# Patient Record
Sex: Female | Born: 1970
Health system: Southern US, Community
[De-identification: ages and names within clinical notes are randomized; demographics above are authoritative.]

## PROBLEM LIST (undated history)

## (undated) DIAGNOSIS — Z973 Presence of spectacles and contact lenses: Secondary | ICD-10-CM

## (undated) DIAGNOSIS — Z9884 Bariatric surgery status: Secondary | ICD-10-CM

## (undated) DIAGNOSIS — T4145XA Adverse effect of unspecified anesthetic, initial encounter: Secondary | ICD-10-CM

## (undated) DIAGNOSIS — D649 Anemia, unspecified: Secondary | ICD-10-CM

## (undated) DIAGNOSIS — R112 Nausea with vomiting, unspecified: Secondary | ICD-10-CM

## (undated) DIAGNOSIS — T8859XA Other complications of anesthesia, initial encounter: Secondary | ICD-10-CM

## (undated) DIAGNOSIS — K219 Gastro-esophageal reflux disease without esophagitis: Secondary | ICD-10-CM

## (undated) DIAGNOSIS — Z87898 Personal history of other specified conditions: Secondary | ICD-10-CM

## (undated) HISTORY — PX: OTHER SURGICAL HISTORY: SHX169

## (undated) HISTORY — PX: GASTRIC BYPASS: SHX52

## (undated) HISTORY — PX: FOOT SURGERY: SHX648

## (undated) HISTORY — PX: TONSILLECTOMY AND ADENOIDECTOMY: SUR1326

---
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
## 1997-08-01 ENCOUNTER — Other Ambulatory Visit: Admission: RE | Admit: 1997-08-01 | Discharge: 1997-08-01 | Payer: Self-pay

## 1998-07-26 ENCOUNTER — Other Ambulatory Visit: Admission: RE | Admit: 1998-07-26 | Discharge: 1998-07-26 | Payer: Self-pay | Admitting: Family Medicine

## 1998-09-04 ENCOUNTER — Ambulatory Visit (HOSPITAL_COMMUNITY): Admission: RE | Admit: 1998-09-04 | Discharge: 1998-09-04 | Payer: Self-pay | Admitting: Family Medicine

## 1998-09-04 ENCOUNTER — Encounter: Payer: Self-pay | Admitting: Family Medicine

## 1999-01-11 ENCOUNTER — Inpatient Hospital Stay (HOSPITAL_COMMUNITY): Admission: AD | Admit: 1999-01-11 | Discharge: 1999-01-14 | Payer: Self-pay | Admitting: Family Medicine

## 1999-02-18 ENCOUNTER — Encounter: Admission: RE | Admit: 1999-02-18 | Discharge: 1999-05-19 | Payer: Self-pay | Admitting: Family Medicine

## 1999-06-18 ENCOUNTER — Encounter: Admission: RE | Admit: 1999-06-18 | Discharge: 1999-09-16 | Payer: Self-pay | Admitting: Family Medicine

## 2000-02-11 ENCOUNTER — Other Ambulatory Visit: Admission: RE | Admit: 2000-02-11 | Discharge: 2000-02-11 | Payer: Self-pay | Admitting: Family Medicine

## 2001-03-16 ENCOUNTER — Other Ambulatory Visit: Admission: RE | Admit: 2001-03-16 | Discharge: 2001-03-16 | Payer: Self-pay | Admitting: Family Medicine

## 2001-03-16 ENCOUNTER — Other Ambulatory Visit: Admission: RE | Admit: 2001-03-16 | Discharge: 2001-03-16 | Payer: Self-pay | Admitting: Unknown Physician Specialty

## 2002-02-10 ENCOUNTER — Ambulatory Visit (HOSPITAL_COMMUNITY): Admission: RE | Admit: 2002-02-10 | Discharge: 2002-02-10 | Payer: Self-pay | Admitting: Family Medicine

## 2002-02-10 ENCOUNTER — Encounter: Payer: Self-pay | Admitting: Family Medicine

## 2002-02-27 ENCOUNTER — Ambulatory Visit (HOSPITAL_BASED_OUTPATIENT_CLINIC_OR_DEPARTMENT_OTHER): Admission: RE | Admit: 2002-02-27 | Discharge: 2002-02-27 | Payer: Self-pay | Admitting: Family Medicine

## 2005-01-08 ENCOUNTER — Ambulatory Visit (HOSPITAL_COMMUNITY): Admission: RE | Admit: 2005-01-08 | Discharge: 2005-01-08 | Payer: Self-pay | Admitting: Family Medicine

## 2005-01-20 ENCOUNTER — Other Ambulatory Visit: Admission: RE | Admit: 2005-01-20 | Discharge: 2005-01-20 | Payer: Self-pay | Admitting: Family Medicine

## 2007-03-23 ENCOUNTER — Ambulatory Visit (HOSPITAL_COMMUNITY): Admission: RE | Admit: 2007-03-23 | Discharge: 2007-03-23 | Payer: Self-pay | Admitting: Obstetrics and Gynecology

## 2007-03-27 ENCOUNTER — Inpatient Hospital Stay (HOSPITAL_COMMUNITY): Admission: AD | Admit: 2007-03-27 | Discharge: 2007-03-27 | Payer: Self-pay | Admitting: Obstetrics and Gynecology

## 2008-04-10 ENCOUNTER — Emergency Department (HOSPITAL_COMMUNITY): Admission: EM | Admit: 2008-04-10 | Discharge: 2008-04-10 | Payer: Self-pay | Admitting: Family Medicine

## 2009-07-16 ENCOUNTER — Inpatient Hospital Stay (HOSPITAL_COMMUNITY): Admission: AD | Admit: 2009-07-16 | Discharge: 2009-07-16 | Payer: Self-pay | Admitting: Obstetrics and Gynecology

## 2009-08-16 ENCOUNTER — Inpatient Hospital Stay (HOSPITAL_COMMUNITY): Admission: AD | Admit: 2009-08-16 | Discharge: 2009-08-18 | Payer: Self-pay | Admitting: Obstetrics and Gynecology

## 2010-03-21 LAB — CBC
HCT: 30.3 % — ABNORMAL LOW (ref 36.0–46.0)
MCHC: 32.6 g/dL (ref 30.0–36.0)
MCV: 70.6 fL — ABNORMAL LOW (ref 78.0–100.0)
Platelets: 201 10*3/uL (ref 150–400)
RDW: 16.6 % — ABNORMAL HIGH (ref 11.5–15.5)
RDW: 17 % — ABNORMAL HIGH (ref 11.5–15.5)
WBC: 8.8 10*3/uL (ref 4.0–10.5)

## 2010-03-21 LAB — RPR: RPR Ser Ql: NONREACTIVE

## 2010-04-16 LAB — POCT URINALYSIS DIP (DEVICE)
Ketones, ur: NEGATIVE mg/dL
Nitrite: NEGATIVE
Protein, ur: NEGATIVE mg/dL

## 2010-05-11 ENCOUNTER — Emergency Department (HOSPITAL_COMMUNITY)
Admission: EM | Admit: 2010-05-11 | Discharge: 2010-05-11 | Disposition: A | Discharge status: Home / Self Care | Payer: BC Managed Care – PPO | Attending: Emergency Medicine | Admitting: Emergency Medicine

## 2010-05-11 ENCOUNTER — Emergency Department (HOSPITAL_COMMUNITY): Payer: BC Managed Care – PPO

## 2010-05-11 DIAGNOSIS — M545 Low back pain, unspecified: Secondary | ICD-10-CM | POA: Insufficient documentation

## 2010-05-11 DIAGNOSIS — M549 Dorsalgia, unspecified: Secondary | ICD-10-CM | POA: Insufficient documentation

## 2010-05-11 LAB — URINALYSIS, ROUTINE W REFLEX MICROSCOPIC
Nitrite: NEGATIVE
Specific Gravity, Urine: 1.008 (ref 1.005–1.030)
Urobilinogen, UA: 0.2 mg/dL (ref 0.0–1.0)
pH: 7 (ref 5.0–8.0)

## 2010-05-11 LAB — COMPREHENSIVE METABOLIC PANEL
ALT: 9 U/L (ref 0–35)
Albumin: 3.3 g/dL — ABNORMAL LOW (ref 3.5–5.2)
Alkaline Phosphatase: 83 U/L (ref 39–117)
Calcium: 8.8 mg/dL (ref 8.4–10.5)
Potassium: 3.7 mEq/L (ref 3.5–5.1)
Sodium: 137 mEq/L (ref 135–145)
Total Protein: 6.3 g/dL (ref 6.0–8.3)

## 2010-05-11 LAB — CBC
MCV: 75.3 fL — ABNORMAL LOW (ref 78.0–100.0)
Platelets: 212 10*3/uL (ref 150–400)
RBC: 4.37 MIL/uL (ref 3.87–5.11)
WBC: 4 10*3/uL (ref 4.0–10.5)

## 2010-05-11 LAB — DIFFERENTIAL
Basophils Absolute: 0 10*3/uL (ref 0.0–0.1)
Basophils Relative: 1 % (ref 0–1)
Eosinophils Absolute: 0.1 10*3/uL (ref 0.0–0.7)
Lymphs Abs: 1.6 10*3/uL (ref 0.7–4.0)
Neutrophils Relative %: 50 % (ref 43–77)

## 2010-05-20 NOTE — Op Note (Signed)
[name], Laura Villanueva            ACCOUNT NO.:  [id number]   MEDICAL RECORD NO.:  [id number]          PATIENT TYPE:  AMB   LOCATION:  SDC                           FACILITY:  [facility]   PHYSICIAN:  Lenoard Aden, M.D.DATE OF BIRTH:  1970/08/12   DATE OF PROCEDURE:  03/23/2007  DATE OF DISCHARGE:                               OPERATIVE REPORT   PREOPERATIVE DIAGNOSIS:  Missed abortion at 9 weeks.   POSTOPERATIVE DIAGNOSIS:  Missed abortion at 9 weeks.   PROCEDURE:  Suction dilatation and evacuation.   SURGEON:  Lenoard Aden, M.D.   ANESTHESIA:  MAC and paracervical block.   ESTIMATED BLOOD LOSS:  Less than 50 mL.   COMPLICATIONS:  None.   DRAINS:  None.   COUNTS:  Correct.   DISPOSITION:  The patient went to the recovery room in good condition.   DESCRIPTION OF PROCEDURE:  After being apprised of the risks of  anesthesia, infection, bleeding, uterine perforation, possible need for  repair, the patient was brought to the operating room and administered  IV sedation without difficulty, prepped and draped in a sterile fashion.  Her feet were placed in the yellow fin stirrups.  Exam under anesthesia  revealed a 9 weeks anteverted uterus.  No adnexal masses.  Dilute  paracervical block using Xylocaine solution was placed at 4 and 8  o'clock in the standard fashion.  The cervix was grasped with a single  tooth tenaculum, dilated atraumatically up to a #27 Pratt dilator, a 9  mm suction curette was placed.  Suction reveals products of conception  noted.  Repeat suction and curettage in four quadrant method reveals the  cavity to be empty.  Good hemostasis was noted.  All instruments were  removed.  The patient is awakened and transferred to recovery in good  condition.  Products of conception are noted and sent to pathology.      Lenoard Aden, M.D.  Electronically Signed     RJT/MEDQ  D:  03/23/2007  T:  03/23/2007  Job:  [id number]

## 2010-09-29 LAB — CBC
HCT: 33.9 — ABNORMAL LOW
Hemoglobin: 11.4 — ABNORMAL LOW
MCV: 76 — ABNORMAL LOW
Platelets: 230
RDW: 17 — ABNORMAL HIGH
WBC: 4.1

## 2011-12-25 IMAGING — US US ABDOMEN COMPLETE
1 series · 14 of 25 positions shown · non-contrast
Comparison: Abdominal CT from 01/08/2005

CLINICAL DATA: 39-year-old with abdominal pain.

COMPLETE ABDOMINAL ULTRASOUND

[Series 1: us abdomen complete · 0.26mm/px · 14 of 61 slices shown]
[im 1/61]
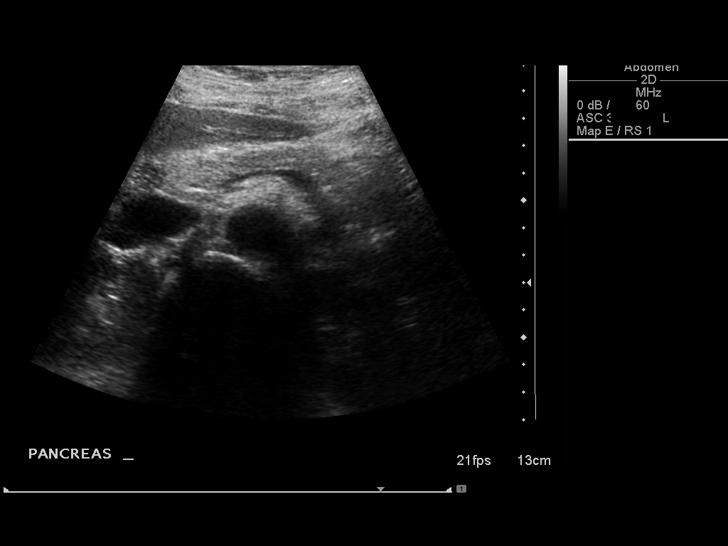
[im 6/61]
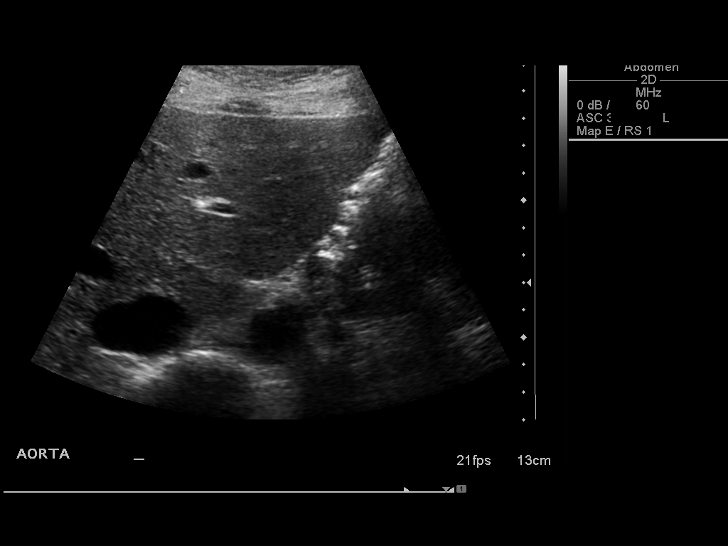
[im 11/61]
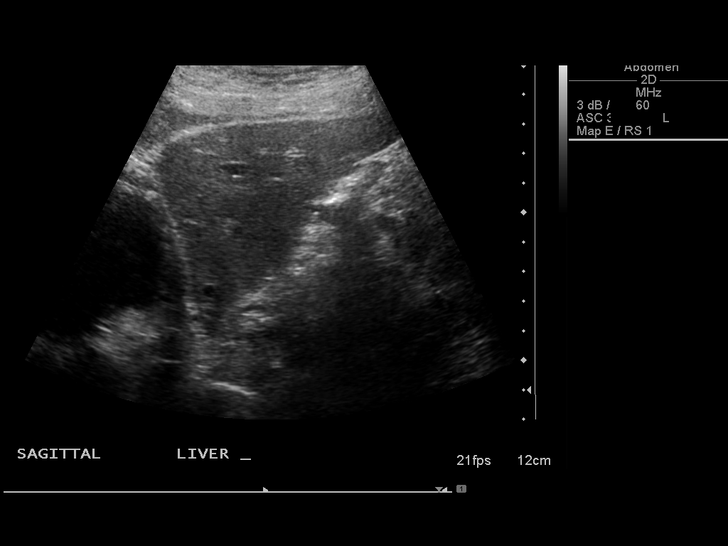
[im 16/61]
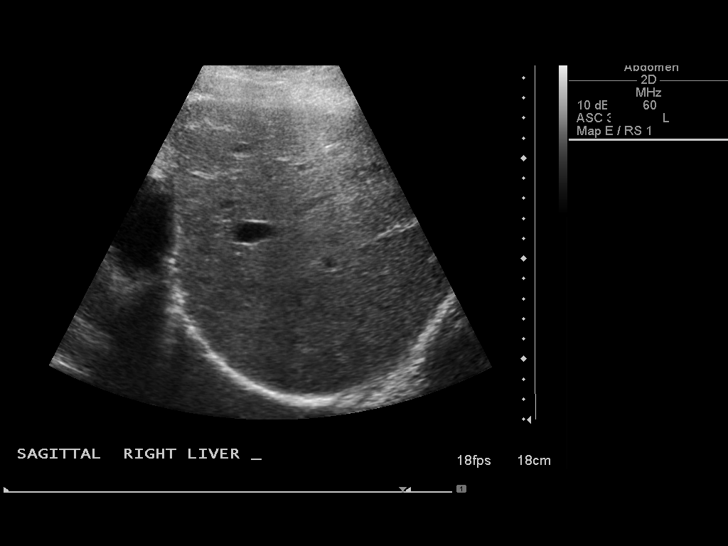
[im 21/61]
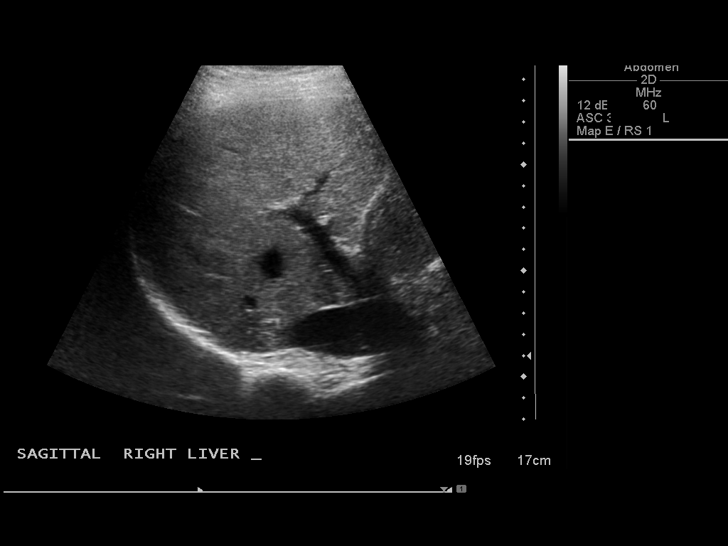
[im 23/61]
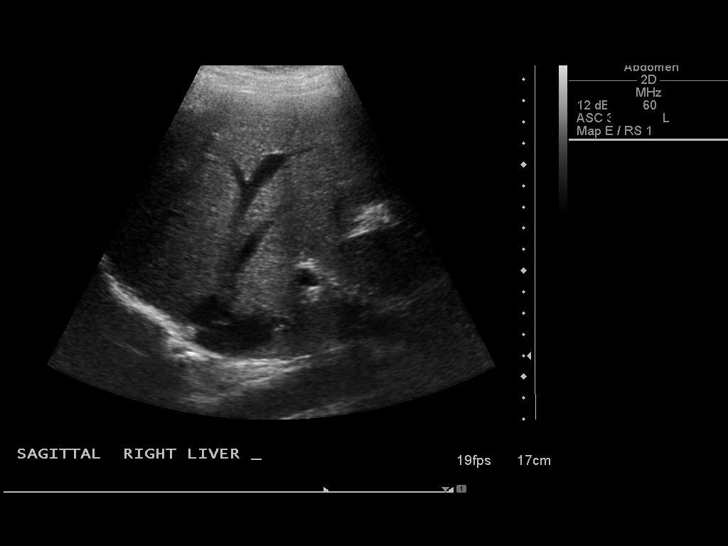
[im 28/61]
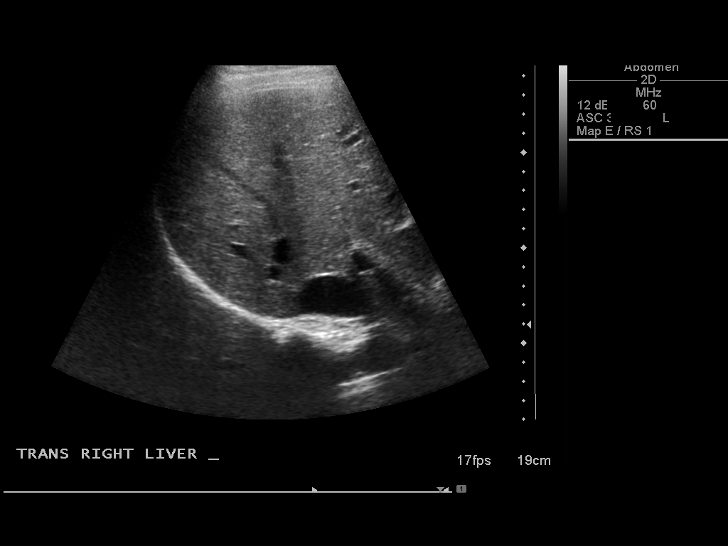
[im 33/61]
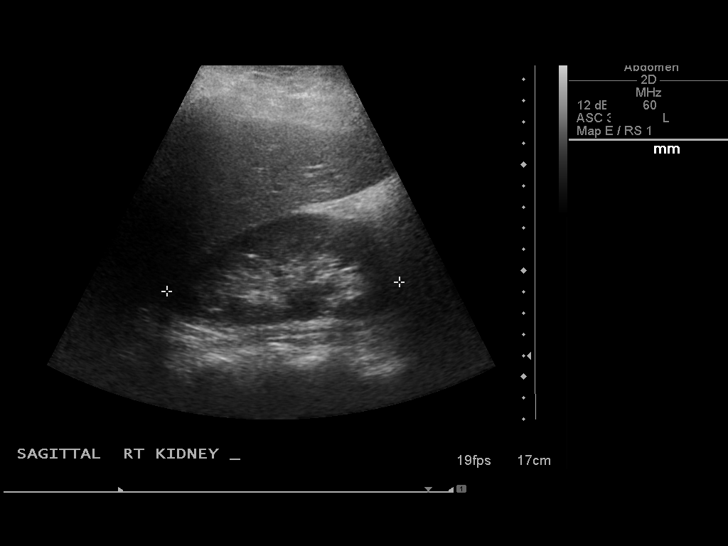
[im 38/61]
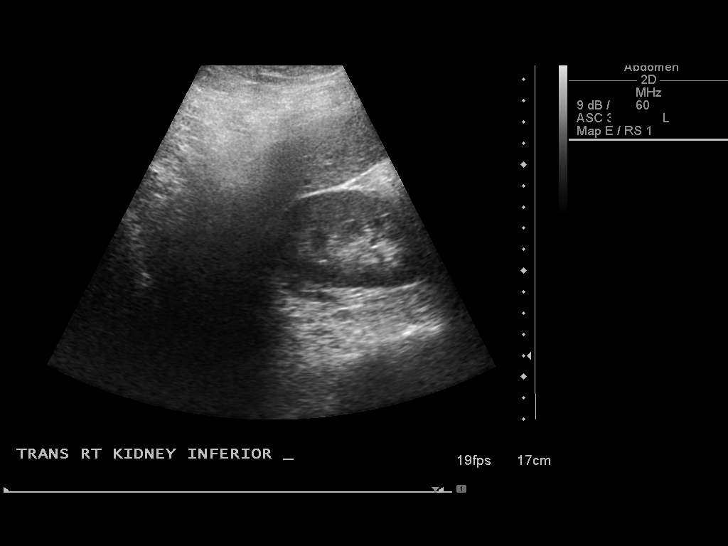
[im 41/61]
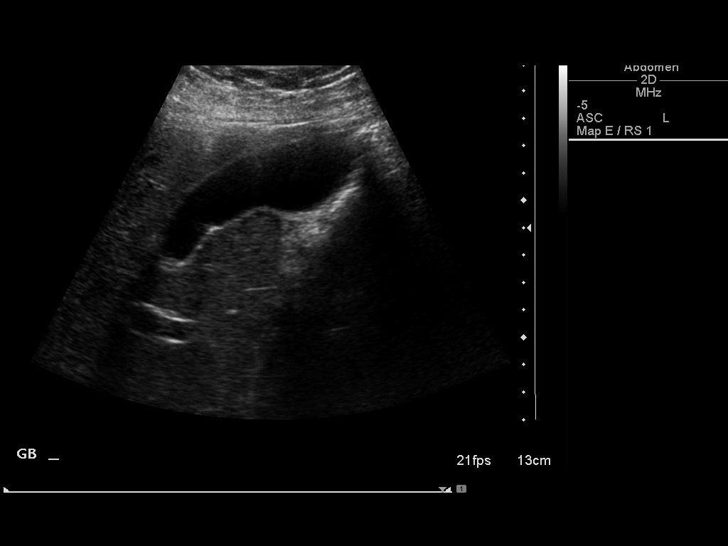
[im 46/61]
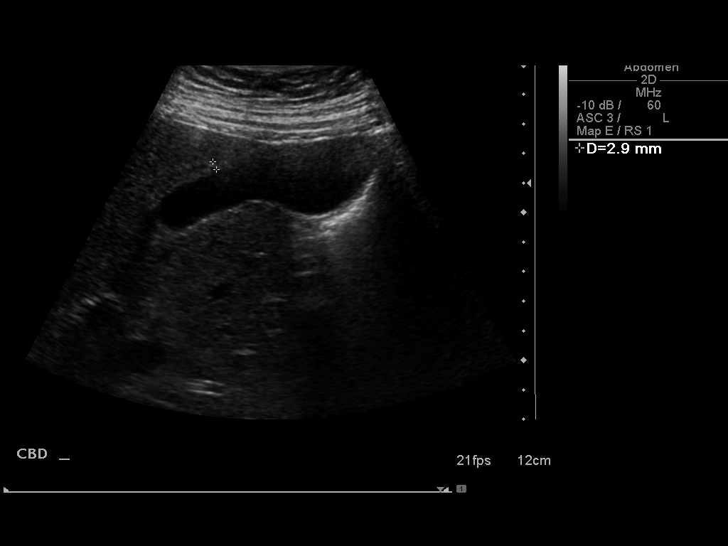
[im 51/61]
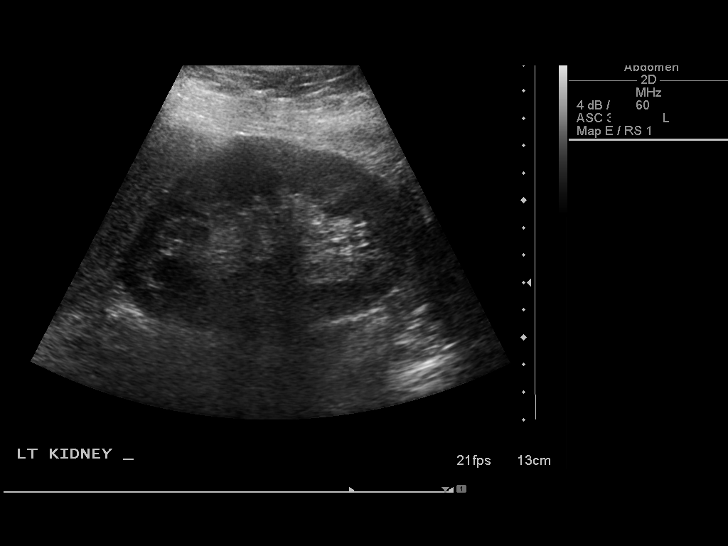
[im 56/61]
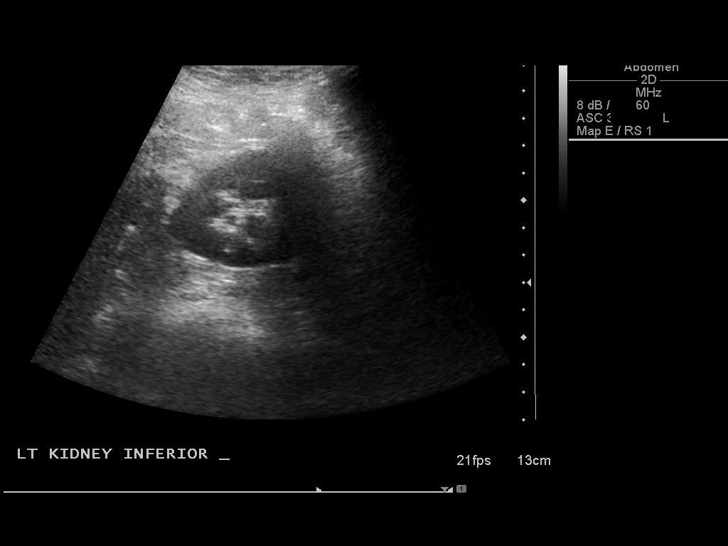
[im 61/61]
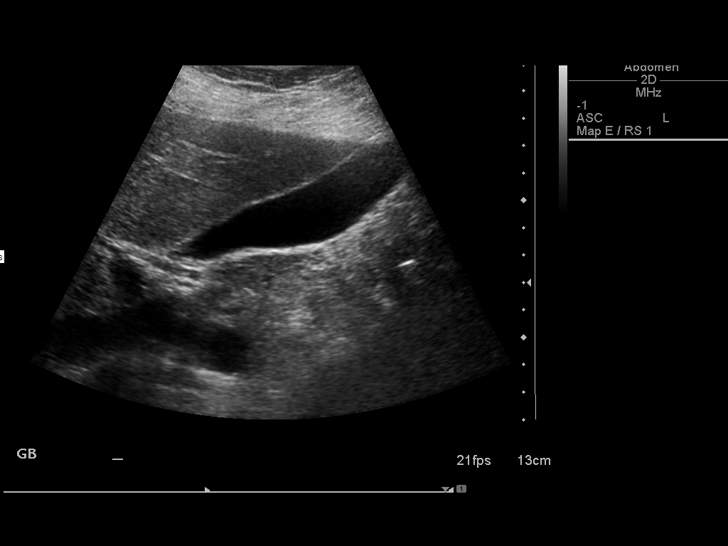

[14 of 25 positions shown; findings below may reference images not displayed]

FINDINGS: Gallbladder:  No gallstones, gallbladder wall thickening, or
pericholecystic fluid.

Common bile duct:  Measures 0.5 cm.

Liver:  No focal lesion identified.  Within normal limits in
parenchymal echogenicity.

IVC:  Appears normal.

Pancreas:  No focal abnormality seen.

Spleen:  Measures 5.1 cm in length.

Right Kidney:  Measures 11.0 cm in length with normal renal
echotexture.  Mild fullness of the right renal pelvis is similar to
the prior CT.  No evidence for hydronephrosis.

Left Kidney:  Measures 11.8 cm in length without hydronephrosis.

Abdominal aorta:  No aneurysm identified.
IMPRESSION: Negative abdominal ultrasound.

## 2012-05-08 ENCOUNTER — Encounter (HOSPITAL_COMMUNITY): Payer: Self-pay | Admitting: *Deleted

## 2012-05-08 ENCOUNTER — Emergency Department (HOSPITAL_COMMUNITY): Payer: 59

## 2012-05-08 ENCOUNTER — Emergency Department (HOSPITAL_COMMUNITY)
Admission: EM | Admit: 2012-05-08 | Discharge: 2012-05-08 | Disposition: A | Discharge status: Home / Self Care | Payer: 59 | Attending: Emergency Medicine | Admitting: Emergency Medicine

## 2012-05-08 DIAGNOSIS — S80862A Insect bite (nonvenomous), left lower leg, initial encounter: Secondary | ICD-10-CM

## 2012-05-08 DIAGNOSIS — R51 Headache: Secondary | ICD-10-CM | POA: Insufficient documentation

## 2012-05-08 DIAGNOSIS — R05 Cough: Secondary | ICD-10-CM

## 2012-05-08 DIAGNOSIS — Y9389 Activity, other specified: Secondary | ICD-10-CM | POA: Insufficient documentation

## 2012-05-08 DIAGNOSIS — J069 Acute upper respiratory infection, unspecified: Secondary | ICD-10-CM | POA: Insufficient documentation

## 2012-05-08 DIAGNOSIS — M25569 Pain in unspecified knee: Secondary | ICD-10-CM | POA: Insufficient documentation

## 2012-05-08 DIAGNOSIS — Z79899 Other long term (current) drug therapy: Secondary | ICD-10-CM | POA: Insufficient documentation

## 2012-05-08 DIAGNOSIS — M545 Low back pain, unspecified: Secondary | ICD-10-CM | POA: Insufficient documentation

## 2012-05-08 DIAGNOSIS — R509 Fever, unspecified: Secondary | ICD-10-CM | POA: Insufficient documentation

## 2012-05-08 DIAGNOSIS — R059 Cough, unspecified: Secondary | ICD-10-CM | POA: Insufficient documentation

## 2012-05-08 DIAGNOSIS — Y929 Unspecified place or not applicable: Secondary | ICD-10-CM | POA: Insufficient documentation

## 2012-05-08 DIAGNOSIS — S90569A Insect bite (nonvenomous), unspecified ankle, initial encounter: Secondary | ICD-10-CM | POA: Insufficient documentation

## 2012-05-08 DIAGNOSIS — M25579 Pain in unspecified ankle and joints of unspecified foot: Secondary | ICD-10-CM | POA: Insufficient documentation

## 2012-05-08 DIAGNOSIS — R110 Nausea: Secondary | ICD-10-CM | POA: Insufficient documentation

## 2012-05-08 DIAGNOSIS — F17200 Nicotine dependence, unspecified, uncomplicated: Secondary | ICD-10-CM | POA: Insufficient documentation

## 2012-05-08 DIAGNOSIS — R52 Pain, unspecified: Secondary | ICD-10-CM | POA: Insufficient documentation

## 2012-05-08 LAB — CBC WITH DIFFERENTIAL/PLATELET
Basophils Absolute: 0 10*3/uL (ref 0.0–0.1)
Basophils Relative: 1 % (ref 0–1)
Eosinophils Absolute: 0 10*3/uL (ref 0.0–0.7)
HCT: 33.9 % — ABNORMAL LOW (ref 36.0–46.0)
Hemoglobin: 10.5 g/dL — ABNORMAL LOW (ref 12.0–15.0)
Lymphocytes Relative: 52 % — ABNORMAL HIGH (ref 12–46)
MCH: 21.6 pg — ABNORMAL LOW (ref 26.0–34.0)
MCHC: 31 g/dL (ref 30.0–36.0)
Monocytes Absolute: 0.3 10*3/uL (ref 0.1–1.0)
Neutro Abs: 0.7 10*3/uL — ABNORMAL LOW (ref 1.7–7.7)
RDW: 17 % — ABNORMAL HIGH (ref 11.5–15.5)

## 2012-05-08 LAB — URINALYSIS, ROUTINE W REFLEX MICROSCOPIC
Bilirubin Urine: NEGATIVE
Hgb urine dipstick: NEGATIVE
Ketones, ur: NEGATIVE mg/dL
Nitrite: NEGATIVE
Protein, ur: NEGATIVE mg/dL
Urobilinogen, UA: 0.2 mg/dL (ref 0.0–1.0)

## 2012-05-08 LAB — BASIC METABOLIC PANEL
BUN: 7 mg/dL (ref 6–23)
Calcium: 8.7 mg/dL (ref 8.4–10.5)
Creatinine, Ser: 0.59 mg/dL (ref 0.50–1.10)
GFR calc Af Amer: >90 mL/min (ref >90)
GFR calc non Af Amer: >90 mL/min (ref >90)
Glucose, Bld: 98 mg/dL (ref 70–99)
Potassium: 4.1 mEq/L (ref 3.5–5.1)

## 2012-05-08 MED ORDER — SODIUM CHLORIDE 0.9 % IV SOLN: Freq: Once | INTRAVENOUS | Status: DC

## 2012-05-08 MED ORDER — DOXYCYCLINE HYCLATE 100 MG PO TABS
100.0000 mg | ORAL_TABLET | Freq: Once | ORAL | Status: AC
  Administered 2012-05-08: 100 mg via ORAL
  Filled 2012-05-08: qty 1

## 2012-05-08 MED ORDER — DOXYCYCLINE HYCLATE 100 MG PO CAPS: 100.0000 mg | ORAL_CAPSULE | Freq: Two times a day (BID) | ORAL | Status: DC

## 2012-05-08 MED ORDER — SODIUM CHLORIDE 0.9 % IV BOLUS (SEPSIS)
500.0000 mL | Freq: Once | INTRAVENOUS | Status: AC
  Administered 2012-05-08: 500 mL via INTRAVENOUS

## 2012-05-08 MED ORDER — DEXAMETHASONE SODIUM PHOSPHATE 10 MG/ML IJ SOLN
10.0000 mg | Freq: Once | INTRAMUSCULAR | Status: AC
  Administered 2012-05-08: 10 mg via INTRAVENOUS
  Filled 2012-05-08: qty 1

## 2012-05-08 NOTE — ED Notes (Signed)
Pt states last Wednesday started having a high fever, cough, body aches, has not gotten any better, now having lower back pain, groin and under arm pain, did find a tick on L lower leg this morning, no noticeable mark from where pt removed tick. Denies n/v/d.

## 2012-05-08 NOTE — ED Provider Notes (Signed)
History     CSN: [id number]  Arrival date & time 05/08/12  1323   First MD Initiated Contact with Patient 05/08/12 1457      Chief Complaint  Patient presents with  . Back Pain  . Tick Removal  . Generalized Body Aches  . Fever    (Consider location/radiation/quality/duration/timing/severity/associated sxs/prior treatment) HPI  Patient is a 42 year old female presenting for generalized body aches with fever and cough for the last week. Patient states most of her pain is in the low back and joints particularly bilateral ankle and knee joints. States this pain is achy in nature without alleviating factors or aggravating factors. Patient has also had nonproductive cough with fever at home. Patient has had tick exposure, removed tick this morning from left calf but denies rash in the area or anywhere else. Patient states she has been in areas for possible tick exposure prior to all symptoms beginning. Also has some associated rib pain is worsened with cough and touch. Associated headache. Denies chest pain, shortness of breath, vomiting, abdominal pain, urinary symptoms, numbness tingling or weakness in extremities, visual disturbance.  History reviewed. No pertinent past medical history.  Past Surgical History  Procedure Laterality Date  . Gastric bypass surgery      No family history on file.  History  Substance Use Topics  . Smoking status: Current Every Day Smoker  . Smokeless tobacco: Never Used  . Alcohol Use: Yes    OB History   Grav Para Term Preterm Abortions TAB SAB Ect Mult Living                  Review of Systems  Constitutional: Positive for fever, chills and fatigue.  HENT: Negative for neck pain and neck stiffness.   Eyes: Negative for visual disturbance.  Respiratory: Positive for cough. Negative for shortness of breath and wheezing.   Cardiovascular: Negative for chest pain and leg swelling.  Gastrointestinal: Positive for nausea. Negative for vomiting  and abdominal pain.  Genitourinary: Negative.   Musculoskeletal: Positive for back pain and arthralgias. Negative for joint swelling.  Skin: Negative for rash.  Neurological: Positive for headaches.    Allergies  Depakene and Sulfa antibiotics  Home Medications   Current Outpatient Rx  Name  Route  Sig  Dispense  Refill  . ibuprofen (ADVIL,MOTRIN) 200 MG tablet   Oral   Take 200 mg by mouth every 6 (six) hours as needed for pain.         Marland Kitchen omeprazole (PRILOSEC OTC) 20 MG tablet   Oral   Take 20 mg by mouth daily.           BP 124/84  Pulse 90  Temp(Src) 99.1 F (37.3 C) (Oral)  SpO2 97%  LMP 05/02/2012  Physical Exam  Constitutional: She is oriented to person, place, and time. She appears well-developed and well-nourished. No distress.  HENT:  Head: Normocephalic and atraumatic.  Mouth/Throat: Oropharynx is clear and moist.  Eyes: Conjunctivae and EOM are normal. Pupils are equal, round, and reactive to light.  Neck: Normal range of motion. Neck supple.  Cardiovascular: Normal rate, regular rhythm and normal heart sounds.   Pulmonary/Chest: Effort normal. No accessory muscle usage. No respiratory distress. She has rhonchi in the right lower field and the left lower field. She exhibits no edema, no deformity and no swelling.    Chest wall tenderness reproducible with palpation  Abdominal: Soft. Bowel sounds are normal. There is no tenderness.  Musculoskeletal: Normal range of  motion.  Lymphadenopathy:    She has no cervical adenopathy.    She has no axillary adenopathy.  Neurological: She is alert and oriented to person, place, and time.  Skin: Skin is warm and dry. She is not diaphoretic.  Psychiatric: She has a normal mood and affect.    ED Course  Procedures (including critical care time)  Labs Reviewed  CBC WITH DIFFERENTIAL - Abnormal; Notable for the following:    WBC 2.1 (*)    Hemoglobin 10.5 (*)    HCT 33.9 (*)    MCV 69.9 (*)    MCH 21.6 (*)     RDW 17.0 (*)    Neutrophils Relative 31 (*)    Lymphocytes Relative 52 (*)    Monocytes Relative 15 (*)    Neutro Abs 0.7 (*)    All other components within normal limits  BASIC METABOLIC PANEL  URINALYSIS, ROUTINE W REFLEX MICROSCOPIC  PATHOLOGIST SMEAR REVIEW   Dg Chest 2 View  05/08/2012  *RADIOLOGY REPORT*  Clinical Data: Tick bite, body aches,  fever  CHEST - 2 VIEW  Comparison: 04/10/2008  Findings: Mild central bronchitic changes consistent with a history of smoking.  No focal pneumonia, collapse, consolidation, edema, effusion or pneumothorax.  Trachea midline.  Postop changes in the epigastric region.  No osseous abnormality.  IMPRESSION: Chronic mild bronchitic changes.  No superimposed pneumonia   Original Report Authenticated By: Judie Petit. Shick, M.D.      1. Tick bite of calf, left, initial encounter   2. Cough   3. URI, acute       MDM  Patient with symptoms consistent with either Lyme Disease or A[place]a Spotted Fever .  Vitals are stable, low-grade fever.  Patient will be started on doxycycline in ER. Advised to follow up with her PCP to be followed. Pt CXR negative for acute infiltrate. Patients symptoms are consistent with URI, likely viral etiology. Discussed that antibiotics are not indicated for viral infections. Pt will be discharged with symptomatic treatment.  Verbalizes understanding and is agreeable with plan. Pt is hemodynamically stable & in NAD prior to dc. Patient d/w with Dr. Radford Pax, agrees with plan. Patient is stable at time of discharge            Jeannetta Ellis, PA-C 05/09/12 0105

## 2012-05-09 LAB — B. BURGDORFI ANTIBODIES: B burgdorferi Ab IgG+IgM: 0.44 {ISR}

## 2012-05-09 NOTE — ED Provider Notes (Signed)
Medical screening examination/treatment/procedure(s) were performed by non-physician practitioner and as supervising physician I was immediately available for consultation/collaboration.   Nelia Shi, MD 05/09/12 [phone]

## 2012-05-10 ENCOUNTER — Telehealth (HOSPITAL_COMMUNITY): Payer: Self-pay | Admitting: Emergency Medicine

## 2012-05-10 NOTE — ED Notes (Signed)
Pt returned call.  Informed results were negative.  F/U w/PCP as needed.

## 2012-05-10 NOTE — ED Notes (Signed)
Pt called and LVM requesting callback regarding Lyme Disease and RMSF test results.  Called pt bac, unavailable.  Left message requesting callback.

## 2013-02-15 ENCOUNTER — Emergency Department (HOSPITAL_COMMUNITY): Payer: 59

## 2013-02-15 ENCOUNTER — Encounter (HOSPITAL_COMMUNITY): Payer: Self-pay | Admitting: Emergency Medicine

## 2013-02-15 ENCOUNTER — Emergency Department (HOSPITAL_COMMUNITY)
Admission: EM | Admit: 2013-02-15 | Discharge: 2013-02-15 | Disposition: A | Discharge status: Home / Self Care | Payer: 59 | Attending: Emergency Medicine | Admitting: Emergency Medicine

## 2013-02-15 DIAGNOSIS — Z9889 Other specified postprocedural states: Secondary | ICD-10-CM | POA: Insufficient documentation

## 2013-02-15 DIAGNOSIS — Z79899 Other long term (current) drug therapy: Secondary | ICD-10-CM | POA: Insufficient documentation

## 2013-02-15 DIAGNOSIS — F17200 Nicotine dependence, unspecified, uncomplicated: Secondary | ICD-10-CM | POA: Insufficient documentation

## 2013-02-15 DIAGNOSIS — S96911A Strain of unspecified muscle and tendon at ankle and foot level, right foot, initial encounter: Secondary | ICD-10-CM

## 2013-02-15 DIAGNOSIS — Y92410 Unspecified street and highway as the place of occurrence of the external cause: Secondary | ICD-10-CM | POA: Insufficient documentation

## 2013-02-15 DIAGNOSIS — Y9389 Activity, other specified: Secondary | ICD-10-CM | POA: Insufficient documentation

## 2013-02-15 DIAGNOSIS — S298XXA Other specified injuries of thorax, initial encounter: Secondary | ICD-10-CM | POA: Insufficient documentation

## 2013-02-15 DIAGNOSIS — S93609A Unspecified sprain of unspecified foot, initial encounter: Secondary | ICD-10-CM | POA: Insufficient documentation

## 2013-02-15 NOTE — Discharge Instructions (Signed)
Use ice on the sore area 3 or 4 times a day. Take ibuprofen for pain.    Sprain A sprain is a tear in one of the strong, fibrous tissues that connect your bones (ligaments). The severity of the sprain depends on how much of the ligament is torn. The tear can be either partial or complete. CAUSES  Often, sprains are a result of a fall or an injury. The force of the impact causes the fibers of your ligament to stretch beyond their normal length. This excess tension causes the fibers of your ligament to tear. SYMPTOMS  You may have some loss of motion or increased pain within your normal range of motion. Other symptoms include:  Bruising.  Tenderness.  Swelling. DIAGNOSIS  In order to diagnose a sprain, your caregiver will physically examine you to determine how torn the ligament is. Your caregiver may also suggest an X-ray exam to make sure no bones are broken. TREATMENT  If your ligament is only partially torn, treatment usually involves keeping the injured area in a fixed position (immobilization) for a short period. To do this, your caregiver will apply a bandage, cast, or splint to keep the area from moving until it heals. For a partially torn ligament, the healing process usually takes 2 to 3 weeks. If your ligament is completely torn, you may need surgery to reconnect the ligament to the bone or to reconstruct the ligament. After surgery, a cast or splint may be applied and will need to stay on for 4 to 6 weeks while your ligament heals. HOME CARE INSTRUCTIONS  Keep the injured area elevated to decrease swelling.  To ease pain and swelling, apply ice to your joint twice a day, for 2 to 3 days.  Put ice in a plastic bag.  Place a towel between your skin and the bag.  Leave the ice on for 15 minutes.  Only take over-the-counter or prescription medicine for pain as directed by your caregiver.  Do not leave the injured area unprotected until pain and stiffness go away (usually 3  to 4 weeks).  Do not allow your cast or splint to get wet. Cover your cast or splint with a plastic bag when you shower or bathe. Do not swim.  Your caregiver may suggest exercises for you to do during your recovery to prevent or limit permanent stiffness. SEEK IMMEDIATE MEDICAL CARE IF:  Your cast or splint becomes damaged.  Your pain becomes worse. MAKE SURE YOU:  Understand these instructions.  Will watch your condition.  Will get help right away if you are not doing well or get worse. Document Released: 12/20/1999 Document Revised: 03/16/2011 Document Reviewed: 01/03/2011 [facility]istsExitCare Patient Information 2014 [employer] [place], [place].
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
## 2013-08-22 ENCOUNTER — Other Ambulatory Visit: Payer: Self-pay

## 2013-08-22 DIAGNOSIS — Z1231 Encounter for screening mammogram for malignant neoplasm of breast: Secondary | ICD-10-CM

## 2013-08-30 ENCOUNTER — Ambulatory Visit: Payer: BC Managed Care – PPO

## 2013-09-12 ENCOUNTER — Ambulatory Visit
Admission: RE | Admit: 2013-09-12 | Discharge: 2013-09-12 | Disposition: A | Discharge status: Home / Self Care | Payer: BC Managed Care – PPO | Source: Ambulatory Visit

## 2013-09-12 DIAGNOSIS — Z1231 Encounter for screening mammogram for malignant neoplasm of breast: Secondary | ICD-10-CM

## 2013-09-15 ENCOUNTER — Other Ambulatory Visit: Payer: Self-pay | Admitting: Obstetrics and Gynecology

## 2013-09-15 DIAGNOSIS — R928 Other abnormal and inconclusive findings on diagnostic imaging of breast: Secondary | ICD-10-CM

## 2013-09-22 ENCOUNTER — Ambulatory Visit
Admission: RE | Admit: 2013-09-22 | Discharge: 2013-09-22 | Disposition: A | Discharge status: Home / Self Care | Payer: BC Managed Care – PPO | Source: Ambulatory Visit | Attending: Obstetrics and Gynecology | Admitting: Obstetrics and Gynecology

## 2013-09-22 DIAGNOSIS — R928 Other abnormal and inconclusive findings on diagnostic imaging of breast: Secondary | ICD-10-CM

## 2014-08-28 ENCOUNTER — Telehealth: Payer: Self-pay | Admitting: Family Medicine

## 2014-08-31 NOTE — Telephone Encounter (Signed)
Called patient and told her she could come pick her shot record up.
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
## 2015-07-17 ENCOUNTER — Ambulatory Visit (HOSPITAL_COMMUNITY)
Admission: EM | Admit: 2015-07-17 | Discharge: 2015-07-17 | Disposition: A | Discharge status: Home / Self Care | Payer: Commercial Managed Care - PPO | Attending: Family Medicine | Admitting: Family Medicine

## 2015-07-17 ENCOUNTER — Encounter (HOSPITAL_COMMUNITY): Payer: Self-pay | Admitting: *Deleted

## 2015-07-17 DIAGNOSIS — B349 Viral infection, unspecified: Secondary | ICD-10-CM

## 2015-07-17 MED ORDER — PREDNISONE 10 MG PO TABS: 20.0000 mg | ORAL_TABLET | Freq: Every day | ORAL | Status: DC

## 2015-07-17 NOTE — ED Notes (Signed)
Pt reports  Symptoms  Of  Eye   Problems   With  Drainage  As  Well  As    Redness   X  6  Days     l  Appears  Somewhat red    r is  Also  But  Less   So   denys  Any  Injury

## 2015-07-17 NOTE — ED Notes (Deleted)
Pt     Reports    Symptoms  Of        Bilateral  Eye  Irritation         With  Onset  Of  Symptoms  Of        Yesterday      Pt placed  On   Gentamycin  Drops  By  The   Web  md       But  Now  Has  Photosensitivity      As  Well  As   Nasal  Drainage

## 2015-07-17 NOTE — Discharge Instructions (Signed)

## 2015-07-17 NOTE — ED Provider Notes (Signed)
CSN: [id number]     Arrival date & time 07/17/15  1138 History   First MD Initiated Contact with Patient 07/17/15 1234     Chief Complaint  Patient presents with  . Eye Problem   (Consider location/radiation/quality/duration/timing/severity/associated sxs/prior Treatment) HPI History obtained from patient:  Pt presents with the cc of:  Upper respiratory symptoms Duration of symptoms: One week Treatment prior to arrival: Over-the-counter medications without relief of symptoms Context: Gradual the sudden onset of upper respiratory symptoms including nasal congestion cough and red eyes Other symptoms include: Feeling a little under the weather Pain score: 0 FAMILY HISTORY: Allergies    History reviewed. No pertinent past medical history. Past Surgical History  Procedure Laterality Date  . Gastric bypass surgery    . Foot surgery     History reviewed. No pertinent family history. Social History  Substance Use Topics  . Smoking status: Former Smoker -- 1.00 packs/day    Types: Cigarettes  . Smokeless tobacco: Never Used  . Alcohol Use: Yes     Comment: ocassional   OB History    No data available     Review of Systems  Denies: HEADACHE, NAUSEA, ABDOMINAL PAIN, CHEST PAIN, CONGESTION, DYSURIA, SHORTNESS OF BREATH  Allergies  Depakene and Sulfa antibiotics  Home Medications   Prior to Admission medications   Medication Sig Start Date End Date Taking? Authorizing Provider  Multiple Vitamins-Minerals (MULTIVITAMINS THER. W/MINERALS) TABS tablet Take 1 tablet by mouth daily.    Historical Provider, MD  predniSONE (DELTASONE) 10 MG tablet Take 2 tablets (20 mg total) by mouth daily. 07/17/15   Tharon Aquas, PA   Meds Ordered and Administered this Visit  Medications - No data to display  BP 121/78 mmHg  Pulse 85  Temp(Src) 98.6 F (37 C) (Oral)  Resp 16  SpO2 100%  LMP 07/16/2015 No data found.   Physical Exam NURSES NOTES AND VITAL SIGNS  REVIEWED. CONSTITUTIONAL: Well developed, well nourished, no acute distress HEENT: normocephalic, atraumatic EYES: Conjunctiva normal NECK:normal ROM, supple, no adenopathy PULMONARY:No respiratory distress, normal effort ABDOMINAL: Soft, ND, NT BS+, No CVAT MUSCULOSKELETAL: Normal ROM of all extremities,  SKIN: warm and dry without rash PSYCHIATRIC: Mood and affect, behavior are normal  ED Course  Procedures (including critical care time)  Labs Review Labs Reviewed - No data to display  Imaging Review No results found.   Visual Acuity Review  Right Eye Distance:   Left Eye Distance:   Bilateral Distance:    Right Eye Near:   Left Eye Near:    Bilateral Near:     Patient has had gastric bypass surgery she is advised that she should not be using ibuprofen. Prescription for prednisone as provided to her. No indication for antibiotics at this time. Patient does not require a return to work note.    MDM   1. Viral illness     Patient is reassured that there are no issues that require transfer to higher level of care at this time or additional tests. Patient is advised to continue home symptomatic treatment. Patient is advised that if there are new or worsening symptoms to attend the emergency department, contact primary care provider, or return to UC. Instructions of care provided discharged home in stable condition.    THIS NOTE WAS GENERATED USING A VOICE RECOGNITION SOFTWARE PROGRAM. ALL REASONABLE EFFORTS  WERE MADE TO PROOFREAD THIS DOCUMENT FOR ACCURACY.  I have verbally reviewed the discharge instructions with the patient. A printed AVS  was given to the patient.  All questions were answered prior to discharge.      Tharon AquasFrank C Josslin Sanjuan, PA 07/17/15 1344

## 2016-07-22 ENCOUNTER — Other Ambulatory Visit: Payer: Self-pay | Admitting: Obstetrics and Gynecology

## 2016-07-22 DIAGNOSIS — Z1231 Encounter for screening mammogram for malignant neoplasm of breast: Secondary | ICD-10-CM

## 2016-08-05 ENCOUNTER — Other Ambulatory Visit: Payer: Self-pay | Admitting: Obstetrics and Gynecology

## 2016-08-05 DIAGNOSIS — N644 Mastodynia: Secondary | ICD-10-CM

## 2016-08-12 ENCOUNTER — Ambulatory Visit
Admission: RE | Admit: 2016-08-12 | Discharge: 2016-08-12 | Disposition: A | Discharge status: Home / Self Care | Payer: No Typology Code available for payment source | Source: Ambulatory Visit | Attending: Obstetrics and Gynecology | Admitting: Obstetrics and Gynecology

## 2016-08-12 ENCOUNTER — Other Ambulatory Visit: Payer: Self-pay | Admitting: Obstetrics and Gynecology

## 2016-08-12 DIAGNOSIS — N644 Mastodynia: Secondary | ICD-10-CM

## 2016-08-12 DIAGNOSIS — N6320 Unspecified lump in the left breast, unspecified quadrant: Secondary | ICD-10-CM

## 2016-08-14 ENCOUNTER — Ambulatory Visit
Admission: RE | Admit: 2016-08-14 | Discharge: 2016-08-14 | Disposition: A | Discharge status: Home / Self Care | Payer: No Typology Code available for payment source | Source: Ambulatory Visit | Attending: Obstetrics and Gynecology | Admitting: Obstetrics and Gynecology

## 2016-08-14 ENCOUNTER — Other Ambulatory Visit: Payer: Self-pay | Admitting: Obstetrics and Gynecology

## 2016-08-14 DIAGNOSIS — N6320 Unspecified lump in the left breast, unspecified quadrant: Secondary | ICD-10-CM

## 2018-09-05 ENCOUNTER — Other Ambulatory Visit: Payer: Self-pay | Admitting: Obstetrics and Gynecology

## 2018-09-30 ENCOUNTER — Other Ambulatory Visit: Payer: Self-pay

## 2018-09-30 ENCOUNTER — Encounter (HOSPITAL_BASED_OUTPATIENT_CLINIC_OR_DEPARTMENT_OTHER): Payer: Self-pay | Admitting: *Deleted

## 2018-09-30 NOTE — Progress Notes (Signed)
Spoke w/ pt via phone for pre-op interview.  Npo after mn. Arrive at [facility].  Needs cbc and serum preg.  Pt getting covid test done 10-01-2018.  Will take wellbutrin and nexium am dos  w/ sips of water.

## 2018-10-01 ENCOUNTER — Other Ambulatory Visit (HOSPITAL_COMMUNITY)
Admission: RE | Admit: 2018-10-01 | Discharge: 2018-10-01 | Disposition: A | Discharge status: Home / Self Care | Payer: No Typology Code available for payment source | Source: Ambulatory Visit | Attending: Obstetrics and Gynecology | Admitting: Obstetrics and Gynecology

## 2018-10-01 DIAGNOSIS — Z20828 Contact with and (suspected) exposure to other viral communicable diseases: Secondary | ICD-10-CM | POA: Insufficient documentation

## 2018-10-01 DIAGNOSIS — Z01812 Encounter for preprocedural laboratory examination: Secondary | ICD-10-CM | POA: Diagnosis present

## 2018-10-02 LAB — NOVEL CORONAVIRUS, NAA (HOSP ORDER, SEND-OUT TO REF LAB; TAT 18-24 HRS): SARS-CoV-2, NAA: NOT DETECTED

## 2018-10-05 ENCOUNTER — Ambulatory Visit (HOSPITAL_BASED_OUTPATIENT_CLINIC_OR_DEPARTMENT_OTHER): Payer: 59 | Admitting: Anesthesiology

## 2018-10-05 ENCOUNTER — Encounter (HOSPITAL_BASED_OUTPATIENT_CLINIC_OR_DEPARTMENT_OTHER)
Admission: RE | Disposition: A | Discharge status: Home / Self Care | Payer: Self-pay | Source: Home / Self Care | Attending: Obstetrics and Gynecology

## 2018-10-05 ENCOUNTER — Other Ambulatory Visit: Payer: Self-pay

## 2018-10-05 ENCOUNTER — Ambulatory Visit (HOSPITAL_BASED_OUTPATIENT_CLINIC_OR_DEPARTMENT_OTHER)
Admission: RE | Admit: 2018-10-05 | Discharge: 2018-10-05 | Disposition: A | Discharge status: Home / Self Care | Payer: 59 | Attending: Obstetrics and Gynecology | Admitting: Obstetrics and Gynecology

## 2018-10-05 ENCOUNTER — Encounter (HOSPITAL_BASED_OUTPATIENT_CLINIC_OR_DEPARTMENT_OTHER): Payer: Self-pay | Admitting: Emergency Medicine

## 2018-10-05 DIAGNOSIS — Z882 Allergy status to sulfonamides status: Secondary | ICD-10-CM | POA: Diagnosis not present

## 2018-10-05 DIAGNOSIS — Z79899 Other long term (current) drug therapy: Secondary | ICD-10-CM | POA: Insufficient documentation

## 2018-10-05 DIAGNOSIS — Z302 Encounter for sterilization: Secondary | ICD-10-CM | POA: Diagnosis present

## 2018-10-05 DIAGNOSIS — K219 Gastro-esophageal reflux disease without esophagitis: Secondary | ICD-10-CM | POA: Insufficient documentation

## 2018-10-05 DIAGNOSIS — F17210 Nicotine dependence, cigarettes, uncomplicated: Secondary | ICD-10-CM | POA: Diagnosis not present

## 2018-10-05 DIAGNOSIS — Z9884 Bariatric surgery status: Secondary | ICD-10-CM | POA: Diagnosis not present

## 2018-10-05 DIAGNOSIS — K660 Peritoneal adhesions (postprocedural) (postinfection): Secondary | ICD-10-CM | POA: Diagnosis not present

## 2018-10-05 HISTORY — DX: Anemia, unspecified: D64.9

## 2018-10-05 HISTORY — PX: LAPAROSCOPIC TUBAL LIGATION: SHX1937

## 2018-10-05 HISTORY — DX: Nausea with vomiting, unspecified: R11.2

## 2018-10-05 HISTORY — DX: Gastro-esophageal reflux disease without esophagitis: K21.9

## 2018-10-05 HISTORY — DX: Bariatric surgery status: Z98.84

## 2018-10-05 HISTORY — DX: Personal history of other specified conditions: Z87.898

## 2018-10-05 HISTORY — DX: Presence of spectacles and contact lenses: Z97.3

## 2018-10-05 HISTORY — DX: Other complications of anesthesia, initial encounter: T88.59XA

## 2018-10-05 LAB — CBC
HCT: 37.2 % (ref 36.0–46.0)
Hemoglobin: 11.3 g/dL — ABNORMAL LOW (ref 12.0–15.0)
MCH: 22.9 pg — ABNORMAL LOW (ref 26.0–34.0)
MCHC: 30.4 g/dL (ref 30.0–36.0)
MCV: 75.3 fL — ABNORMAL LOW (ref 80.0–100.0)
Platelets: 376 10*3/uL (ref 150–400)
RBC: 4.94 MIL/uL (ref 3.87–5.11)
RDW: 17.6 % — ABNORMAL HIGH (ref 11.5–15.5)
WBC: 6.8 10*3/uL (ref 4.0–10.5)
nRBC: 0 % (ref 0.0–0.2)

## 2018-10-05 LAB — HCG, SERUM, QUALITATIVE: Preg, Serum: NEGATIVE

## 2018-10-05 SURGERY — LIGATION, FALLOPIAN TUBE, LAPAROSCOPIC
Anesthesia: General | Site: Abdomen | Laterality: Bilateral

## 2018-10-05 MED ORDER — FENTANYL CITRATE (PF) 100 MCG/2ML IJ SOLN
INTRAMUSCULAR | Status: AC
  Filled 2018-10-05: qty 2

## 2018-10-05 MED ORDER — OXYCODONE-ACETAMINOPHEN 5-325 MG PO TABS: 1.0000 | ORAL_TABLET | ORAL | 0 refills | Status: DC | PRN

## 2018-10-05 MED ORDER — ONDANSETRON HCL 4 MG/2ML IJ SOLN
INTRAMUSCULAR | Status: AC
  Filled 2018-10-05: qty 2

## 2018-10-05 MED ORDER — CEFAZOLIN SODIUM-DEXTROSE 2-4 GM/100ML-% IV SOLN
INTRAVENOUS | Status: AC
  Filled 2018-10-05: qty 100

## 2018-10-05 MED ORDER — CEFAZOLIN SODIUM-DEXTROSE 2-4 GM/100ML-% IV SOLN
2.0000 g | INTRAVENOUS | Status: AC
  Administered 2018-10-05: 2 g via INTRAVENOUS
  Filled 2018-10-05: qty 100

## 2018-10-05 MED ORDER — KETOROLAC TROMETHAMINE 30 MG/ML IJ SOLN
INTRAMUSCULAR | Status: AC
  Filled 2018-10-05: qty 1

## 2018-10-05 MED ORDER — MIDAZOLAM HCL 2 MG/2ML IJ SOLN
INTRAMUSCULAR | Status: DC | PRN
  Administered 2018-10-05: 2 mg via INTRAVENOUS

## 2018-10-05 MED ORDER — MIDAZOLAM HCL 2 MG/2ML IJ SOLN
INTRAMUSCULAR | Status: AC
  Filled 2018-10-05: qty 2

## 2018-10-05 MED ORDER — ROCURONIUM BROMIDE 10 MG/ML (PF) SYRINGE
PREFILLED_SYRINGE | INTRAVENOUS | Status: DC | PRN
  Administered 2018-10-05: 50 mg via INTRAVENOUS

## 2018-10-05 MED ORDER — PROPOFOL 10 MG/ML IV BOLUS
INTRAVENOUS | Status: DC | PRN
  Administered 2018-10-05: 200 mg via INTRAVENOUS

## 2018-10-05 MED ORDER — OXYCODONE HCL 5 MG PO TABS
5.0000 mg | ORAL_TABLET | Freq: Once | ORAL | Status: DC | PRN
  Filled 2018-10-05: qty 1

## 2018-10-05 MED ORDER — ROCURONIUM BROMIDE 10 MG/ML (PF) SYRINGE
PREFILLED_SYRINGE | INTRAVENOUS | Status: AC
  Filled 2018-10-05: qty 10

## 2018-10-05 MED ORDER — FENTANYL CITRATE (PF) 100 MCG/2ML IJ SOLN
INTRAMUSCULAR | Status: DC | PRN
  Administered 2018-10-05 (×3): 50 ug via INTRAVENOUS

## 2018-10-05 MED ORDER — BUPIVACAINE HCL (PF) 0.25 % IJ SOLN
INTRAMUSCULAR | Status: DC | PRN
  Administered 2018-10-05: 10 mL

## 2018-10-05 MED ORDER — SUGAMMADEX SODIUM 200 MG/2ML IV SOLN
INTRAVENOUS | Status: DC | PRN
  Administered 2018-10-05: 170 mg via INTRAVENOUS

## 2018-10-05 MED ORDER — DEXAMETHASONE SODIUM PHOSPHATE 10 MG/ML IJ SOLN
INTRAMUSCULAR | Status: AC
  Filled 2018-10-05: qty 1

## 2018-10-05 MED ORDER — KETOROLAC TROMETHAMINE 30 MG/ML IJ SOLN
INTRAMUSCULAR | Status: DC | PRN
  Administered 2018-10-05: 30 mg via INTRAVENOUS

## 2018-10-05 MED ORDER — DEXAMETHASONE SODIUM PHOSPHATE 10 MG/ML IJ SOLN
INTRAMUSCULAR | Status: DC | PRN
  Administered 2018-10-05 (×2): 5 mg via INTRAVENOUS

## 2018-10-05 MED ORDER — ONDANSETRON HCL 4 MG/2ML IJ SOLN
4.0000 mg | Freq: Once | INTRAMUSCULAR | Status: AC | PRN
  Administered 2018-10-05: 12:00:00 4 mg via INTRAVENOUS
  Filled 2018-10-05: qty 2

## 2018-10-05 MED ORDER — ONDANSETRON HCL 4 MG/2ML IJ SOLN
INTRAMUSCULAR | Status: DC | PRN
  Administered 2018-10-05: 4 mg via INTRAVENOUS

## 2018-10-05 MED ORDER — FENTANYL CITRATE (PF) 100 MCG/2ML IJ SOLN
25.0000 ug | INTRAMUSCULAR | Status: DC | PRN
  Filled 2018-10-05: qty 1

## 2018-10-05 MED ORDER — OXYCODONE HCL 5 MG/5ML PO SOLN
5.0000 mg | Freq: Once | ORAL | Status: DC | PRN
  Filled 2018-10-05: qty 5

## 2018-10-05 MED ORDER — LIDOCAINE 2% (20 MG/ML) 5 ML SYRINGE
INTRAMUSCULAR | Status: AC
  Filled 2018-10-05: qty 5

## 2018-10-05 MED ORDER — LACTATED RINGERS IV SOLN
INTRAVENOUS | Status: DC
  Administered 2018-10-05: 09:00:00 via INTRAVENOUS
  Filled 2018-10-05: qty 1000

## 2018-10-05 MED ORDER — LIDOCAINE 2% (20 MG/ML) 5 ML SYRINGE
INTRAMUSCULAR | Status: DC | PRN
  Administered 2018-10-05: 60 mg via INTRAVENOUS

## 2018-10-05 MED FILL — OXYCODONE-ACETAMINOPHEN 5-3: 5-325 | 4 days supply | Qty: 20 | Fill #0

## 2018-10-05 SURGICAL SUPPLY — 25 items
ADH SKN CLS APL DERMABOND .7 (GAUZE/BANDAGES/DRESSINGS) ×1
CATH ROBINSON RED A/P 16FR (CATHETERS) ×3 IMPLANT
DERMABOND ADVANCED (GAUZE/BANDAGES/DRESSINGS) ×2
DERMABOND ADVANCED .7 DNX12 (GAUZE/BANDAGES/DRESSINGS) ×1 IMPLANT
DRSG OPSITE POSTOP 3X4 (GAUZE/BANDAGES/DRESSINGS) ×2 IMPLANT
DURAPREP 26ML APPLICATOR (WOUND CARE) ×3 IMPLANT
GLOVE BIO SURGEON STRL SZ7.5 (GLOVE) ×6 IMPLANT
GLOVE BIOGEL PI IND STRL 7.0 (GLOVE) ×2 IMPLANT
GLOVE BIOGEL PI INDICATOR 7.0 (GLOVE) ×4
GOWN STRL REUS W/ TWL LRG LVL3 (GOWN DISPOSABLE) ×2 IMPLANT
GOWN STRL REUS W/TWL LRG LVL3 (GOWN DISPOSABLE) ×6
HIBICLENS CHG 4% 4OZ BTL (MISCELLANEOUS) ×3 IMPLANT
NEEDLE INSUFFLATION 120MM (ENDOMECHANICALS) ×3 IMPLANT
PACK LAPAROSCOPY BASIN (CUSTOM PROCEDURE TRAY) ×3 IMPLANT
PACK TRENDGUARD 450 HYBRID PRO (MISCELLANEOUS) IMPLANT
PROTECTOR NERVE ULNAR (MISCELLANEOUS) ×6 IMPLANT
SOLUTION ELECTROLUBE (MISCELLANEOUS) ×3 IMPLANT
SUT VICRYL 0 UR6 27IN ABS (SUTURE) ×3 IMPLANT
SUT VICRYL 4-0 PS2 18IN ABS (SUTURE) IMPLANT
TOWEL OR 17X24 6PK STRL BLUE (TOWEL DISPOSABLE) ×4 IMPLANT
TRENDGUARD 450 HYBRID PRO PACK (MISCELLANEOUS)
TROCAR OPTI TIP 5M 100M (ENDOMECHANICALS) ×2 IMPLANT
TROCAR XCEL DIL TIP R 11M (ENDOMECHANICALS) ×3 IMPLANT
TUBING INSUF HEATED (TUBING) ×3 IMPLANT
WARMER LAPAROSCOPE (MISCELLANEOUS) ×3 IMPLANT

## 2018-10-05 NOTE — Anesthesia Procedure Notes (Signed)
Procedure Name: Intubation Date/Time: 10/05/2018 10:26 AM Performed by: Suan Halter, CRNA Pre-anesthesia Checklist: Patient identified, Emergency Drugs available, Suction available and Patient being monitored Patient Re-evaluated:Patient Re-evaluated prior to induction Oxygen Delivery Method: Circle system utilized Preoxygenation: Pre-oxygenation with 100% oxygen Induction Type: IV induction Ventilation: Mask ventilation without difficulty Laryngoscope Size: Mac and 3 Grade View: Grade I Tube type: Oral Tube size: 7.0 mm Number of attempts: 1 Airway Equipment and Method: Stylet and Oral airway Placement Confirmation: ETT inserted through vocal cords under direct vision,  positive ETCO2 and breath sounds checked- equal and bilateral Secured at: 21 cm Tube secured with: Tape Dental Injury: Teeth and Oropharynx as per pre-operative assessment

## 2018-10-05 NOTE — Anesthesia Postprocedure Evaluation (Signed)
Anesthesia Post Note  Patient: Laura Villanueva  Procedure(s) Performed: LAPAROSCOPIC TUBAL LIGATION (Bilateral Abdomen)     Patient location during evaluation: PACU Anesthesia Type: General Level of consciousness: awake and alert and oriented Pain management: pain level controlled Vital Signs Assessment: post-procedure vital signs reviewed and stable Respiratory status: spontaneous breathing, nonlabored ventilation and respiratory function stable Cardiovascular status: blood pressure returned to baseline Postop Assessment: no apparent nausea or vomiting Anesthetic complications: no    Last Vitals:  Vitals:   10/05/18 1123 10/05/18 1130  BP:    Pulse: 65 (!) 55  Resp: 15 14  Temp:    SpO2: 100% 95%    Last Pain:  Vitals:   10/05/18 1130  TempSrc:   PainSc: [place]

## 2018-10-05 NOTE — H&P (Signed)
Laura Villanueva is an 48 y.o. female. For elective TL.  Pertinent Gynecological History: Menses: flow is moderate Bleeding: dysfunctional uterine bleeding Contraception: none DES exposure: denies Blood transfusions: none Sexually transmitted diseases: no past history Previous GYN Procedures: DNC  Last mammogram: normal Date: 2020 Last pap: normal Date: 2020 OB History: G2, P2   Menstrual History: Menarche age: 57 Patient's last menstrual period was 09/30/2018 (exact date).    Past Medical History:  Diagnosis Date  . Anemia   . Complication of anesthesia    "I am behaviorally erractic when I wake up."   . GERD (gastroesophageal reflux disease)    occasional, takes nexium  . History of febrile seizure    childhood, none since  . PONV (postoperative nausea and vomiting)   . S/P gastric bypass 2003 approx. @ [facility]  . Wears glasses     Past Surgical History:  Procedure Laterality Date  . FOOT SURGERY Right 1993   "had extra bone and have retained hardware"  . GASTRIC BYPASS  2003  approx.   @[facility]   . TONSILLECTOMY AND ADENOIDECTOMY  child    History reviewed. No pertinent family history.  Social History:  reports that she has been smoking cigarettes. She has a 15.00 pack-year smoking history. She has never used smokeless tobacco. She reports current alcohol use. She reports that she does not use drugs.  Allergies:  Allergies  Allergen Reactions  . Depakene [Valproic Acid] Anaphylaxis  . Sulfa Antibiotics Hives    Medications Prior to Admission  Medication Sig Dispense Refill Last Dose  . Ascorbic Acid (VITAMIN C PO) Take by mouth daily.   Past Week at Unknown time  . B COMPLEX-C PO Take by mouth daily.   Past Week at Unknown time  . buPROPion (WELLBUTRIN SR) 150 MG 12 hr tablet Take 150 mg by mouth 2 (two) times daily.   10/05/2018 at 0600  . Calcium Citrate-Vitamin D (CALCIUM + D PO) Take by mouth daily.   Past Week at Unknown time  . esomeprazole (NEXIUM) 20 MG  capsule Take 20 mg by mouth daily at 12 noon.   10/05/2018 at 0600  . Multiple Vitamins-Minerals (MULTIVITAMINS THER. W/MINERALS) TABS tablet Take 1 tablet by mouth daily.   Past Week at Unknown time    Review of Systems  Constitutional: Negative.   All other systems reviewed and are negative.   Blood pressure 121/90, pulse 79, temperature 98.2 F (36.8 C), temperature source Oral, resp. rate 16, height 5\' 3"  (1.6 m), weight 84.1 kg, last menstrual period 09/30/2018, SpO2 100 %. Physical Exam  Nursing note and vitals reviewed. Constitutional: She is oriented to person, place, and time. She appears well-developed and well-nourished.  HENT:  Head: Normocephalic and atraumatic.  Neck: Normal range of motion. Neck supple.  Cardiovascular: Normal rate and regular rhythm.  Respiratory: Effort normal and breath sounds normal.  GI: Soft. Bowel sounds are normal.  Genitourinary:    Vagina and uterus normal.   Neurological: She is alert and oriented to person, place, and time.  Skin: Skin is warm and dry.  Psychiatric: She has a normal mood and affect.    Results for orders placed or performed during the hospital encounter of 10/05/18 (from the past 24 hour(s))  CBC     Status: Abnormal   Collection Time: 10/05/18  8:40 AM  Result Value Ref Range   WBC 6.8 4.0 - 10.5 K/uL   RBC 4.94 3.87 - 5.11 MIL/uL   Hemoglobin 11.3 (L)  12.0 - 15.0 g/dL   HCT 37.2 36.0 - 46.0 %   MCV 75.3 (L) 80.0 - 100.0 fL   MCH 22.9 (L) 26.0 - 34.0 pg   MCHC 30.4 30.0 - 36.0 g/dL   RDW 17.6 (H) 11.5 - 15.5 %   Platelets 376 150 - 400 K/uL   nRBC 0.0 0.0 - 0.2 %  hCG, serum, qualitative     Status: None   Collection Time: 10/05/18  8:40 AM  Result Value Ref Range   Preg, Serum NEGATIVE NEGATIVE    No results found.  Assessment/Plan: Elective TL LTL Surgical risks discussed. Consent done.  Noreene Boreman J 10/05/2018, 9:36 AM

## 2018-10-05 NOTE — Op Note (Signed)
10/05/2018  11:02 AM  PATIENT:  Laura Villanueva  48 y.o. female  PRE-OPERATIVE DIAGNOSIS:  Desires Sterilization  POST-OPERATIVE DIAGNOSIS:  Desires Sterilization  PROCEDURE:  Procedure(s): LAPAROSCOPIC TUBAL LIGATION  SURGEON:  Surgeon(s): Brien Few, MD  ASSISTANTS: none   ANESTHESIA:   local and general  ESTIMATED BLOOD LOSS: minimal  DRAINS: none   LOCAL MEDICATIONS USED:  MARCAINE    and Amount: 10 ml  SPECIMEN:  No Specimen  DISPOSITION OF SPECIMEN:  N/A  COUNTS:  YES  DICTATION #: [id number]  PLAN OF CARE: dc home  PATIENT DISPOSITION:  PACU - hemodynamically stable.

## 2018-10-05 NOTE — Anesthesia Preprocedure Evaluation (Signed)
Anesthesia Evaluation  Patient identified by MRN, date of birth, ID band Patient awake    Reviewed: Allergy & Precautions, NPO status , Patient's Chart, lab work & pertinent test results  History of Anesthesia Complications (+) PONVNegative for: history of anesthetic complications  Airway Mallampati: I  TM Distance: >3 FB Neck ROM: Full    Dental  (+) Teeth Intact   Pulmonary neg pulmonary ROS, Current SmokerPatient did not abstain from smoking.,    Pulmonary exam normal        Cardiovascular negative cardio ROS Normal cardiovascular exam     Neuro/Psych negative neurological ROS  negative psych ROS   GI/Hepatic Neg liver ROS, GERD  ,S/p gastric bypass   Endo/Other  negative endocrine ROS  Renal/GU negative Renal ROS  negative genitourinary   Musculoskeletal negative musculoskeletal ROS (+)   Abdominal   Peds  Hematology negative hematology ROS (+)   Anesthesia Other Findings   Reproductive/Obstetrics negative OB ROS                            Anesthesia Physical Anesthesia Plan  ASA: II  Anesthesia Plan: General   Post-op Pain Management:    Induction: Intravenous  PONV Risk Score and Plan: 4 or greater and Ondansetron, Dexamethasone, Treatment may vary due to age or medical condition and Midazolam  Airway Management Planned: Oral ETT  Additional Equipment: None  Intra-op Plan:   Post-operative Plan: Extubation in OR  Informed Consent: I have reviewed the patients History and Physical, chart, labs and discussed the procedure including the risks, benefits and alternatives for the proposed anesthesia with the patient or authorized representative who has indicated his/her understanding and acceptance.     Dental advisory given  Plan Discussed with:   Anesthesia Plan Comments:        Anesthesia Quick Evaluation

## 2018-10-05 NOTE — Discharge Instructions (Signed)
° ° ° ° °  Post Anesthesia Home Care Instructions  Activity: Get plenty of rest for the remainder of the day. A responsible individual must stay with you for 24 hours following the procedure.  For the next 24 hours, DO NOT: -Drive a car -Paediatric nurse -Drink alcoholic beverages -Take any medication unless instructed by your physician -Make any legal decisions or sign important papers.  Meals: Start with liquid foods such as gelatin or soup. Progress to regular foods as tolerated. Avoid greasy, spicy, heavy foods. If nausea and/or vomiting occur, drink only clear liquids until the nausea and/or vomiting subsides. Call your physician if vomiting continues.  Special Instructions/Symptoms: Your throat may feel dry or sore from the anesthesia or the breathing tube placed in your throat during surgery. If this causes discomfort, gargle with warm salt water. The discomfort should disappear within 24 hours.  If you had a scopolamine patch placed behind your ear for the management of post- operative nausea and/or vomiting:  1. The medication in the patch is effective for 72 hours, after which it should be removed.  Wrap patch in a tissue and discard in the trash. Wash hands thoroughly with soap and water. 2. You may remove the patch earlier than 72 hours if you experience unpleasant side effects which may include dry mouth, dizziness or visual disturbances. 3. Avoid touching the patch. Wash your hands with soap and water after contact with the patch.      Do not take any nonsteroidal anti inflammatories until after 5:00 pm today.

## 2018-10-05 NOTE — Op Note (Signed)
NAME: Laura Villanueva, DROZDOWSKI MEDICAL RECORD [id number] ACCOUNT [id number] DATE OF BIRTH:27-Jun-1970 FACILITY: [facility] LOCATION: [facility] PHYSICIAN:Edelyn Heidel J. Ronita Hipps, MD  OPERATIVE REPORT  DATE OF PROCEDURE:  10/05/2018  PREOPERATIVE DIAGNOSIS:  Desire for elective sterilization.  POSTOPERATIVE DIAGNOSIS:  Desire for elective sterilization.  PROCEDURE:  Laparoscopic tubal ligation.  SURGEON:  Brien Few, MD  ASSISTANT:  None.  ANESTHESIA:  General and local.  ESTIMATED BLOOD LOSS:  Minimal.  DISPOSITION:  The patient was taken to recovery in good condition.  SPECIMENS:  None.  BRIEF OPERATIVE NOTE:  After being apprised of the risks of anesthesia, infection, bleeding, injury to surrounding organs, possible need for repair, delayed versus immediate complications including bowel and bladder injury, possible need for repair, the patient was brought to the operating room and administered general anesthetic in usual fashion, placed in dorsal lithotomy position.  Feet were placed in the Yell[place]in stirrups.  Exam under  anesthesia revealed a small anteflexed uterus.  Hulka tenaculum placed vaginally after the patient was prepped and draped in usual sterile fashion and catheterized until the bladder was empty.  Infraumbilical incision was made with a scalpel.  Veress  needle placed.  Opening pressure -4.  Three liters CO2 insufflated without difficulty.  Trocar placed atraumatically.  Visualization revealed a small strand of omental adhesions in the mid upper quadrant from previous abdominal surgery.  Normal liver,  gallbladder bed, normal appendiceal area.  No evidence of pelvic adhesions.  Left tube traced out to the fimbriated end.  An ampullary isthmic portion of the tube was cauterized using bipolar cautery in 3 contiguous areas.  Similarly, the right tube was  traced out to the fimbriated end and cauterized in 3 contiguous areas, and the ampullary isthmic portion of the tube was  noted.  At this time, both tubes were divided in the cauterized area using straight scissors.  Tubal lumens were visualized and  separated nicely.  At this time, no irrigation was needed.  CO2 was released.  Revisualization revealed no evidence of bleeding, but bilateral normal ovaries were noted.  Normal anterior and posterior cul-de-sac were noted.  All instruments were then  removed atraumatically from the abdomen.  CO2 was released.  Positive pressure applied.  Incision closed with 0 Vicryl, 4-0 Vicryl, Dermabond.  Dilute Marcaine solution 10 mL placed.  Hulka tenaculum removed vaginally.  The patient tolerated the  procedure well, was awakened, and transferred to recovery in good condition.  LN/NUANCE  D:10/05/2018 T:10/05/2018 JOB:[id number]

## 2018-10-05 NOTE — Transfer of Care (Signed)
Immediate Anesthesia Transfer of Care Note  Patient: Laura Villanueva  Procedure(s) Performed: Procedure(s) (LRB): LAPAROSCOPIC TUBAL LIGATION (Bilateral)  Patient Location: PACU  Anesthesia Type: General  Level of Consciousness: awake, oriented, sedated and patient cooperative  Airway & Oxygen Therapy: Patient Spontanous Breathing and Patient connected to face mask oxygen  Post-op Assessment: Report given to PACU RN and Post -op Vital signs reviewed and stable  Post vital signs: Reviewed and stable  Complications: No apparent anesthesia complications Last Vitals:  Vitals Value Taken Time  BP 102/69 10/05/18 1245  Temp 36.7 C 10/05/18 1245  Pulse 66 10/05/18 1245  Resp 20 10/05/18 1245  SpO2 97 % 10/05/18 1245    Last Pain:  Vitals:   10/05/18 1245  TempSrc:   PainSc: 2       Patients Stated Pain Goal: 4 (10/05/18 1145)

## 2018-10-06 ENCOUNTER — Encounter (HOSPITAL_BASED_OUTPATIENT_CLINIC_OR_DEPARTMENT_OTHER): Payer: Self-pay | Admitting: Obstetrics and Gynecology

## 2019-04-25 ENCOUNTER — Other Ambulatory Visit: Payer: Self-pay

## 2019-04-25 ENCOUNTER — Encounter: Payer: Self-pay | Admitting: Plastic Surgery

## 2019-04-25 ENCOUNTER — Ambulatory Visit (INDEPENDENT_AMBULATORY_CARE_PROVIDER_SITE_OTHER): Payer: Self-pay | Admitting: Plastic Surgery

## 2019-04-25 DIAGNOSIS — Z719 Counseling, unspecified: Secondary | ICD-10-CM

## 2019-04-26 ENCOUNTER — Encounter: Payer: Self-pay | Admitting: Plastic Surgery

## 2019-04-26 DIAGNOSIS — Z719 Counseling, unspecified: Secondary | ICD-10-CM | POA: Insufficient documentation

## 2019-04-26 NOTE — Progress Notes (Signed)
Patient ID: Laura Villanueva, female    DOB: [birth year], 49 y.o.   MRN: [id number]   Chief Complaint  Patient presents with  . Advice Only    for tummy tuck    The patient is a 49 year old female here for evaluation of her abdomen.  She underwent gastric bypass surgery ~ 2003.  She had foot surgery and a tubal ligation.  She is 5 feet 3 inches tall and weighs 157 pounds.  She has been able to keep her weight controlled and off since her gastric surgery.  She is very motivated.  She is interested in abdominoplasty to remove the excess skin.  She also mentions thigh lift and brachioplasty.  But her primary interest at the moment is her abdomen.  There is no sign of hernia, mass and no tenderness.  She is okay if the current tattoo ends up getting removed even if in part.  She is currently smoking.   Review of Systems  Constitutional: Negative.  Negative for activity change and appetite change.  HENT: Negative.   Eyes: Negative.   Respiratory: Negative.   Cardiovascular: Negative.   Gastrointestinal: Negative.  Negative for abdominal distention and abdominal pain.  Endocrine: Negative.   Genitourinary: Negative.   Musculoskeletal: Negative.   Skin: Negative for color change and wound.  Neurological: Negative.   Psychiatric/Behavioral: Negative.     Past Medical History:  Diagnosis Date  . Anemia   . Complication of anesthesia    "I am behaviorally erractic when I wake up."   . GERD (gastroesophageal reflux disease)    occasional, takes nexium  . History of febrile seizure    childhood, none since  . PONV (postoperative nausea and vomiting)   . S/P gastric bypass 2003 approx. @ [facility]  . Wears glasses     Past Surgical History:  Procedure Laterality Date  . FOOT SURGERY Right 1993   "had extra bone and have retained hardware"  . GASTRIC BYPASS  2003  approx.   @[facility]   . LAPAROSCOPIC TUBAL LIGATION Bilateral 10/05/2018   Procedure: LAPAROSCOPIC TUBAL LIGATION;  Surgeon:  10/07/2018, MD;  Location: [facility] [place];  Service: Gynecology;  Laterality: Bilateral;  . TONSILLECTOMY AND ADENOIDECTOMY  child      Current Outpatient Medications:  .  Ascorbic Acid (VITAMIN C PO), Take by mouth daily., Disp: , Rfl:  .  B COMPLEX-C PO, Take by mouth daily., Disp: , Rfl:  .  buPROPion (WELLBUTRIN SR) 150 MG 12 hr tablet, Take 150 mg by mouth 2 (two) times daily., Disp: , Rfl:  .  Calcium Citrate-Vitamin D (CALCIUM + D PO), Take by mouth daily., Disp: , Rfl:  .  esomeprazole (NEXIUM) 20 MG capsule, Take 20 mg by mouth daily at 12 noon., Disp: , Rfl:  .  Multiple Vitamins-Minerals (MULTIVITAMINS THER. W/MINERALS) TABS tablet, Take 1 tablet by mouth daily., Disp: , Rfl:    Objective:   Vitals:   04/25/19 1422  BP: 125/78  Pulse: 88  Temp: 98 F (36.7 C)  SpO2: 98%    Physical Exam Vitals and nursing note reviewed.  Constitutional:      Appearance: Normal appearance.  HENT:     Head: Normocephalic and atraumatic.  Cardiovascular:     Rate and Rhythm: Normal rate.     Pulses: Normal pulses.  Pulmonary:     Effort: Pulmonary effort is normal.  Abdominal:     General: Abdomen is flat. There is no distension.  Palpations: There is no mass.     Tenderness: There is no abdominal tenderness.     Hernia: No hernia is present.  Skin:    General: Skin is warm.     Capillary Refill: Capillary refill takes less than 2 seconds.  Neurological:     General: No focal deficit present.     Mental Status: She is alert and oriented to person, place, and time.  Psychiatric:        Mood and Affect: Mood normal.        Behavior: Behavior normal.        Thought Content: Thought content normal.     Assessment & Plan:  Encounter for counseling  The patient is a good candidate for abdominoplasty.  We will give her a quote. Must be a minimum of 6 weeks tobacco and nicotine free.  It is better for 3 months but the minimum of 6 weeks.  The risks and  complications are significantly higher if she has nicotine in her system.  Pictures were obtained of the patient and placed in the chart with the patient's or guardian's permission.   [place], DO

## 2019-05-15 ENCOUNTER — Telehealth: Payer: Self-pay | Admitting: Plastic Surgery

## 2019-05-15 NOTE — Telephone Encounter (Signed)
Left message on primary number for patient to return call. Date of messages left are 4/22, 4/26, 4/30, 5/10. Will send contact letter to ensure that I am following through with patient, if she is still interested in proceeding with surgery.   Laura Villanueva

## 2022-07-14 ENCOUNTER — Ambulatory Visit (HOSPITAL_COMMUNITY)
Admission: EM | Admit: 2022-07-14 | Discharge: 2022-07-14 | Disposition: A | Discharge status: Home / Self Care | Payer: Managed Care, Other (non HMO) | Attending: Internal Medicine | Admitting: Internal Medicine

## 2022-07-14 ENCOUNTER — Encounter (HOSPITAL_COMMUNITY): Payer: Self-pay

## 2022-07-14 DIAGNOSIS — Z1152 Encounter for screening for COVID-19: Secondary | ICD-10-CM | POA: Insufficient documentation

## 2022-07-14 DIAGNOSIS — W57XXXS Bitten or stung by nonvenomous insect and other nonvenomous arthropods, sequela: Secondary | ICD-10-CM | POA: Insufficient documentation

## 2022-07-14 DIAGNOSIS — R509 Fever, unspecified: Secondary | ICD-10-CM | POA: Insufficient documentation

## 2022-07-14 LAB — POCT INFLUENZA A/B
Influenza A, POC: NEGATIVE
Influenza B, POC: NEGATIVE

## 2022-07-14 MED ORDER — ACETAMINOPHEN 325 MG PO TABS
650.0000 mg | ORAL_TABLET | Freq: Once | ORAL | Status: AC
  Administered 2022-07-14: 650 mg via ORAL

## 2022-07-14 MED ORDER — ACETAMINOPHEN 325 MG PO TABS
ORAL_TABLET | ORAL | Status: AC
  Filled 2022-07-14: qty 2

## 2022-07-14 NOTE — ED Provider Notes (Signed)
[facility]    CSN: [id number] Arrival date & time: 07/14/22  1250     History   Chief Complaint Chief Complaint  Patient presents with   Fever    HPI Laura Villanueva is a 52 y.o. female.  Here with 3 day history of fever, chills, body aches No sore throat, congestion, cough, abdominal pain, NVD, rash Has been taking ibuprofen that helps temporarily  No recent travel, no known sick contacts   Reports multiple tick bites over the last several months. [place]. Dogs have tick bites as well No skin changes around the previous bites  She is concerned about RMSF or ehrlichiosis.  Reports hx of suspected RMSF although antibody tests were all negative  Her father had ehrlichiosis several years ago  Past Medical History:  Diagnosis Date   Anemia    Complication of anesthesia    "I am behaviorally erractic when I wake up."    GERD (gastroesophageal reflux disease)    occasional, takes nexium   History of febrile seizure    childhood, none since   PONV (postoperative nausea and vomiting)    S/P gastric bypass 2003 approx. @ [facility]   Wears glasses     Patient Active Problem List   Diagnosis Date Noted   Encounter for counseling 04/26/2019    Past Surgical History:  Procedure Laterality Date   FOOT SURGERY Right 1993   "had extra bone and have retained hardware"   GASTRIC BYPASS  2003  approx.   @[facility]    LAPAROSCOPIC TUBAL LIGATION Bilateral 10/05/2018   Procedure: LAPAROSCOPIC TUBAL LIGATION;  Surgeon: Olivia Mackie, MD;  Location: [facility];  Service: Gynecology;  Laterality: Bilateral;   TONSILLECTOMY AND ADENOIDECTOMY  child    OB History   No obstetric history on file.      Home Medications    Prior to Admission medications   Medication Sig Start Date End Date Taking? Authorizing Provider  Ascorbic Acid (VITAMIN C PO) Take by mouth daily.   Yes [provider]  B COMPLEX-C PO Take by mouth daily.   Yes  [provider]  Calcium Citrate-Vitamin D (CALCIUM + D PO) Take by mouth daily.   Yes [provider]  esomeprazole (NEXIUM) 20 MG capsule Take 20 mg by mouth daily at 12 noon.   Yes [provider]  Multiple Vitamins-Minerals (MULTIVITAMINS THER. W/MINERALS) TABS tablet Take 1 tablet by mouth daily.   Yes [provider]    Family History History reviewed. No pertinent family history.  Social History Social History   Tobacco Use   Smoking status: Every Day    Packs/day: 1.00    Years: 15.00    Additional pack years: 0.00    Total pack years: 15.00    Types: Cigarettes   Smokeless tobacco: Never   Tobacco comments:    09-30-2018  per pt started taking wellbutrin to stop smoking  Substance Use Topics   Alcohol use: Yes    Comment: ocassional   Drug use: No     Allergies   Depakene [valproic acid] and Sulfa antibiotics   Review of Systems Review of Systems  Constitutional:  Positive for fever.   Per HPI  Physical Exam Triage Vital Signs ED Triage Vitals [07/14/22 1357]  Enc Vitals Group     BP 129/85     Pulse Rate (!) 110     Resp 16     Temp (!) 102.1 F (38.9 C)  Temp Source Oral     SpO2 95 %     Weight 184 lb (83.5 kg)     Height 5' 3.25" (1.607 m)     Head Circumference      Peak Flow      Pain Score 3     Pain Loc      Pain Edu?      Excl. in GC?    No data found.  Updated Vital Signs BP 129/85 (BP Location: Left Arm)   Pulse 100   Temp 98.4 F (36.9 C)   Resp 16   Ht 5' 3.25" (1.607 m)   Wt 184 lb (83.5 kg)   LMP 05/14/2022 (Approximate)   SpO2 97%   BMI 32.34 kg/m    Physical Exam Vitals and nursing note reviewed.  Constitutional:      General: She is not in acute distress.    Appearance: Normal appearance. She is not ill-appearing or diaphoretic.  HENT:     Right Ear: Tympanic membrane and ear canal normal.     Left Ear: Tympanic membrane and ear canal normal.     Nose: No congestion or  rhinorrhea.     Mouth/Throat:     Mouth: Mucous membranes are moist.     Pharynx: Oropharynx is clear. No posterior oropharyngeal erythema.  Eyes:     Conjunctiva/sclera: Conjunctivae normal.     Pupils: Pupils are equal, round, and reactive to light.  Cardiovascular:     Rate and Rhythm: Normal rate and regular rhythm.     Pulses: Normal pulses.     Heart sounds: Normal heart sounds.  Pulmonary:     Effort: Pulmonary effort is normal.     Breath sounds: Normal breath sounds.  Abdominal:     Palpations: Abdomen is soft.  Musculoskeletal:        General: Normal range of motion.     Cervical back: Normal range of motion. No rigidity.  Lymphadenopathy:     Cervical: No cervical adenopathy.  Skin:    General: Skin is warm and dry.     Findings: No erythema, lesion or rash.  Neurological:     Mental Status: She is alert and oriented to person, place, and time.     UC Treatments / Results  Labs (all labs ordered are listed, but only abnormal results are displayed) Labs Reviewed  SARS CORONAVIRUS 2 (TAT 6-24 HRS)  POCT INFLUENZA A/B    EKG  Radiology No results found.  Procedures Procedures  Medications Ordered in UC Medications  acetaminophen (TYLENOL) tablet 650 mg (650 mg Oral Given 07/14/22 1402)    Initial Impression / Assessment and Plan / UC Course  I have reviewed the triage vital signs and the nursing notes.  Pertinent labs & imaging results that were available during my care of the patient were reviewed by me and considered in my medical decision making (see chart for details).  Fever 102 on arrival. Tylenol dose given, fever resolved. Patient with improved symptoms, stated she is feeling better.  Flu A/B negative. Covid pending.   Exam overall unremarkable.  At this time many possible etiologies of symptoms. We have seen several febrile illnesses in the urgent care over the last month. Very well could be viral given today is day 3 of symptoms. Discussion  about this with patient. Recommend see how she feels over the next few days with continued symptomatic care (tylenol, ibuprofen, fluids, rest).  Referral to ID placed for further testing if  indicated.  Advised if symptoms do not improve or if at any point they worsen, needs to be seen in ED for testing and further treatment. Patient reports she would not go to the ED and that's why she came here. We discussed specific symptoms and signs that would require evaluation and higher level of care in ED. Patient agrees to plan.  Final Clinical Impressions(s) / UC Diagnoses   Final diagnoses:  Fever, unspecified  Tick bite, unspecified site, sequela     Discharge Instructions      Flu test was negative. We will call you if your covid test returns positive (~24 hours)  Please continue alternating tylenol and ibuprofen for fever/aches. Drink lots of fluids! Symptoms should improve in the next several days to a week if this is viral.  I have placed a referral to infectious disease.  They will call you to make an appointment and you can further discuss testing with them.  If at any point your symptoms worsen, please go to the emergency department     ED Prescriptions   None    PDMP not reviewed this encounter.   Candence Sease, Lurena Joiner, [place] 07/14/22 2440

## 2022-07-14 NOTE — ED Triage Notes (Signed)
Patient here today with c/o fever, chills, fatigue, and body aches X 3 days. Some loss of appetite. She states that it feels the same as when she had RMSF a couple years ago. Father in hospital with ehrlichiosis and she is worried about that. She has been taking IBU with some improvement.

## 2022-07-14 NOTE — Discharge Instructions (Addendum)
Flu test was negative. We will call you if your covid test returns positive (~24 hours)  Please continue alternating tylenol and ibuprofen for fever/aches. Drink lots of fluids! Symptoms should improve in the next several days to a week if this is viral.  I have placed a referral to infectious disease.  They will call you to make an appointment and you can further discuss testing with them.  If at any point your symptoms worsen, please go to the emergency department

## 2022-07-15 LAB — SARS CORONAVIRUS 2 (TAT 6-24 HRS): SARS Coronavirus 2: NEGATIVE

## 2023-04-04 ENCOUNTER — Emergency Department (HOSPITAL_BASED_OUTPATIENT_CLINIC_OR_DEPARTMENT_OTHER): Payer: Self-pay | Admitting: Radiology

## 2023-04-04 ENCOUNTER — Other Ambulatory Visit: Payer: Self-pay

## 2023-04-04 ENCOUNTER — Encounter (HOSPITAL_BASED_OUTPATIENT_CLINIC_OR_DEPARTMENT_OTHER): Payer: Self-pay

## 2023-04-04 ENCOUNTER — Emergency Department (HOSPITAL_BASED_OUTPATIENT_CLINIC_OR_DEPARTMENT_OTHER)
Admission: EM | Admit: 2023-04-04 | Discharge: 2023-04-04 | Disposition: A | Discharge status: Home / Self Care | Payer: Self-pay | Attending: Emergency Medicine | Admitting: Emergency Medicine

## 2023-04-04 DIAGNOSIS — W010XXA Fall on same level from slipping, tripping and stumbling without subsequent striking against object, initial encounter: Secondary | ICD-10-CM | POA: Insufficient documentation

## 2023-04-04 DIAGNOSIS — M25532 Pain in left wrist: Secondary | ICD-10-CM | POA: Insufficient documentation

## 2023-04-04 NOTE — ED Triage Notes (Signed)
 Patient arrives POV with complaints left hand/wrist pain to due having a fall. Patient states that she fell on her hand. Rates her pain a 3/10.

## 2023-04-04 NOTE — Discharge Instructions (Signed)
 It was a pleasure taking part in your care.  As discussed, your x-ray imaging is negative for acute fracture.  Please begin taking ibuprofen or Tylenol every 6 hours as needed for pain.  Please utilize wrist brace at home for support.  You may wear this during the day and remove it at night or to shower.  Please follow-up with your PCP.  Return to the ED with any new or worsening symptoms.

## 2023-04-04 NOTE — ED Provider Notes (Signed)
 [place] EMERGENCY DEPARTMENT AT [facility] Provider Note   CSN: [id number] Arrival date & time: 04/04/23  1040     History  Chief Complaint  Patient presents with   Hand Pain    Left    Fall    Laura Villanueva is a 53 y.o. female who presents to the ED for evaluation of left hand pain.  Reports that yesterday she had a ground-level fall.  Reports that she tripped over her cat.  Denies preceding chest pain or shortness of breath.  Denies striking her head during this fall.  Reports she landed on her outstretched left hand.  States that she has pain to the dorsal surface of her left wrist.  Denies a history of surgical intervention to her left wrist.  Denies any redness or swelling.  States she is been taking ibuprofen at home with slight relief.   Hand Pain  Fall       Home Medications Prior to Admission medications   Medication Sig Start Date End Date Taking? Authorizing Provider  Ascorbic Acid (VITAMIN C PO) Take by mouth daily.    [provider]  B COMPLEX-C PO Take by mouth daily.    [provider]  Calcium Citrate-Vitamin D (CALCIUM + D PO) Take by mouth daily.    [provider]  esomeprazole (NEXIUM) 20 MG capsule Take 20 mg by mouth daily at 12 noon.    [provider]  Multiple Vitamins-Minerals (MULTIVITAMINS THER. W/MINERALS) TABS tablet Take 1 tablet by mouth daily.    [provider]      Allergies    Depakene [valproic acid] and Sulfa antibiotics    Review of Systems   Review of Systems  Musculoskeletal:  Positive for arthralgias.  All other systems reviewed and are negative.   Physical Exam Updated Vital Signs BP 136/89 (BP Location: Right Arm)   Pulse 86   Temp 97.8 F (36.6 C)   Resp 18   Ht 5' 3.5" (1.613 m)   Wt 83.5 kg   SpO2 100%   BMI 32.10 kg/m  Physical Exam Vitals and nursing note reviewed.  Constitutional:      General: She is not in acute distress.    Appearance: She  is well-developed.  HENT:     Head: Normocephalic and atraumatic.  Eyes:     Conjunctiva/sclera: Conjunctivae normal.  Cardiovascular:     Rate and Rhythm: Normal rate and regular rhythm.     Heart sounds: No murmur heard. Pulmonary:     Effort: Pulmonary effort is normal. No respiratory distress.     Breath sounds: Normal breath sounds.  Abdominal:     Palpations: Abdomen is soft.     Tenderness: There is no abdominal tenderness.  Musculoskeletal:        General: No swelling.     Cervical back: Neck supple.     Comments: TTP of dorsum of left wrist.  No overlying skin change, redness, swelling.  Full active and passive range of motion of left wrist appreciated.  No snuffbox tenderness.  Skin:    General: Skin is warm and dry.     Capillary Refill: Capillary refill takes less than 2 seconds.  Neurological:     Mental Status: She is alert.  Psychiatric:        Mood and Affect: Mood normal.     ED Results / Procedures / Treatments   Labs (all labs ordered are listed, but only abnormal results are displayed)  Labs Reviewed - No data to display  EKG None  Radiology DG Hand Complete Left Result Date: 04/04/2023 CLINICAL DATA:  Wrist and hand pain after falling. EXAM: LEFT HAND - COMPLETE 3+ VIEW; LEFT WRIST - COMPLETE 3+ VIEW COMPARISON:  None Available. FINDINGS: The mineralization and alignment are normal. There is no evidence of acute fracture or dislocation. The joint spaces appear preserved. No foreign body or soft tissue emphysema identified. IMPRESSION: No evidence of acute fracture or dislocation in the left hand or wrist. Electronically Signed   By: Carey Bullocks M.D.   On: 04/04/2023 11:06   DG Wrist Complete Left Result Date: 04/04/2023 CLINICAL DATA:  Wrist and hand pain after falling. EXAM: LEFT HAND - COMPLETE 3+ VIEW; LEFT WRIST - COMPLETE 3+ VIEW COMPARISON:  None Available. FINDINGS: The mineralization and alignment are normal. There is no evidence of acute  fracture or dislocation. The joint spaces appear preserved. No foreign body or soft tissue emphysema identified. IMPRESSION: No evidence of acute fracture or dislocation in the left hand or wrist. Electronically Signed   By: Carey Bullocks M.D.   On: 04/04/2023 11:06    Procedures Procedures   Medications Ordered in ED Medications - No data to display  ED Course/ Medical Decision Making/ A&P  Medical Decision Making Amount and/or Complexity of Data Reviewed Radiology: ordered.   53 year old female presents for evaluation of left wrist pain.  Please see HPI for further details.  On exam patient left wrist without overlying skin change, deformity or redness or swelling.  No snuffbox tenderness.  Good grip strength.  Full flexion and extension of left wrist, inversion and eversion passively and actively.  Will collect x-ray imaging.  X-ray imaging unremarkable, shows no signs of acute fractures.  Shows no evidence of acute fracture or dislocation of left hand or wrist.  Patient placed in wrist bone at this time.  Advised to take ibuprofen and Tylenol for pain.  Encouraged to follow-up with PCP and she voiced understanding.  Stable to discharge home.   Final Clinical Impression(s) / ED Diagnoses Final diagnoses:  Left wrist pain    Rx / DC Orders ED Discharge Orders     None         Al Decant, PA-C 04/04/23 1132    Tegeler, Canary Brim, MD 04/04/23 [phone]
